# Patient Record
Sex: Male | Born: 1960 | Race: White | Hispanic: No | Marital: Married | Smoking: Never smoker
Health system: Southern US, Community
[De-identification: ages and names within clinical notes are randomized; demographics above are authoritative.]

## PROBLEM LIST (undated history)

## (undated) DIAGNOSIS — M1990 Unspecified osteoarthritis, unspecified site: Secondary | ICD-10-CM

## (undated) DIAGNOSIS — E785 Hyperlipidemia, unspecified: Secondary | ICD-10-CM

## (undated) DIAGNOSIS — C801 Malignant (primary) neoplasm, unspecified: Secondary | ICD-10-CM

## (undated) HISTORY — PX: OTHER SURGICAL HISTORY: SHX169

## (undated) HISTORY — DX: Hyperlipidemia, unspecified: E78.5

## (undated) HISTORY — PX: PROSTATE SURGERY: SHX751

---

## 2008-07-21 LAB — HM COLONOSCOPY

## 2010-09-02 ENCOUNTER — Other Ambulatory Visit: Payer: Self-pay | Admitting: Urology

## 2010-09-02 ENCOUNTER — Ambulatory Visit (HOSPITAL_COMMUNITY)
Admission: RE | Admit: 2010-09-02 | Discharge: 2010-09-02 | Disposition: A | Discharge status: Home / Self Care | Payer: BC Managed Care – PPO | Source: Ambulatory Visit | Attending: Urology | Admitting: Urology

## 2010-09-02 ENCOUNTER — Encounter (HOSPITAL_COMMUNITY): Payer: BC Managed Care – PPO

## 2010-09-02 ENCOUNTER — Other Ambulatory Visit (HOSPITAL_COMMUNITY): Payer: Self-pay | Admitting: Urology

## 2010-09-02 DIAGNOSIS — Z01818 Encounter for other preprocedural examination: Secondary | ICD-10-CM | POA: Insufficient documentation

## 2010-09-02 DIAGNOSIS — C61 Malignant neoplasm of prostate: Secondary | ICD-10-CM | POA: Insufficient documentation

## 2010-09-02 DIAGNOSIS — I498 Other specified cardiac arrhythmias: Secondary | ICD-10-CM | POA: Insufficient documentation

## 2010-09-02 DIAGNOSIS — Z01810 Encounter for preprocedural cardiovascular examination: Secondary | ICD-10-CM | POA: Insufficient documentation

## 2010-09-02 DIAGNOSIS — Z01812 Encounter for preprocedural laboratory examination: Secondary | ICD-10-CM | POA: Insufficient documentation

## 2010-09-02 LAB — CBC
Hemoglobin: 15.4 g/dL (ref 13.0–17.0)
MCH: 31 pg (ref 26.0–34.0)
MCHC: 35 g/dL (ref 30.0–36.0)
MCV: 88.5 fL (ref 78.0–100.0)
RBC: 4.97 MIL/uL (ref 4.22–5.81)

## 2010-09-02 LAB — BASIC METABOLIC PANEL
BUN: 23 mg/dL (ref 6–23)
CO2: 29 mEq/L (ref 19–32)
Calcium: 10.3 mg/dL (ref 8.4–10.5)
Creatinine, Ser: 0.83 mg/dL (ref 0.50–1.35)
GFR calc non Af Amer: >60 mL/min (ref >60)
Glucose, Bld: 96 mg/dL (ref 70–99)
Sodium: 137 mEq/L (ref 135–145)

## 2010-09-02 LAB — SURGICAL PCR SCREEN: Staphylococcus aureus: INVALID — AB

## 2010-09-05 LAB — MRSA CULTURE

## 2010-09-09 ENCOUNTER — Other Ambulatory Visit: Payer: Self-pay | Admitting: Urology

## 2010-09-09 ENCOUNTER — Inpatient Hospital Stay (HOSPITAL_COMMUNITY)
Admission: RE | Admit: 2010-09-09 | Discharge: 2010-09-11 | DRG: 335 | Disposition: A | Discharge status: Home / Self Care | Payer: BC Managed Care – PPO | Source: Ambulatory Visit | Attending: Urology | Admitting: Urology

## 2010-09-09 DIAGNOSIS — Z01812 Encounter for preprocedural laboratory examination: Secondary | ICD-10-CM

## 2010-09-09 DIAGNOSIS — C61 Malignant neoplasm of prostate: Principal | ICD-10-CM | POA: Diagnosis present

## 2010-09-09 DIAGNOSIS — E7800 Pure hypercholesterolemia, unspecified: Secondary | ICD-10-CM | POA: Diagnosis present

## 2010-09-09 LAB — HEMOGLOBIN AND HEMATOCRIT, BLOOD: HCT: 40.3 % (ref 39.0–52.0)

## 2010-09-10 LAB — HEMOGLOBIN AND HEMATOCRIT, BLOOD
HCT: 36.2 % — ABNORMAL LOW (ref 39.0–52.0)
Hemoglobin: 12.6 g/dL — ABNORMAL LOW (ref 13.0–17.0)

## 2010-09-10 NOTE — Op Note (Signed)
Scott Rodgers, NAUTA NO.:  [id number]  MEDICAL RECORD NO.:  [id number]  LOCATION:  1413                         FACILITY:  [facility]  PHYSICIAN:  Heloise Purpura, MD      DATE OF BIRTH:  1960/11/10  DATE OF PROCEDURE:  09/09/2010 DATE OF DISCHARGE:                              OPERATIVE REPORT   PREOPERATIVE DIAGNOSIS:  Clinically localized adenocarcinoma of the prostate (clinical stage T2a Nx Mx).  POSTOPERATIVE DIAGNOSIS:  Clinically localized adenocarcinoma of the prostate (clinical stage T2a Nx Mx).  PROCEDURE: 1. Robotic assisted laparoscopic radical prostatectomy (bilateral     nerve sparing). 2. Bilateral robotic assisted laparoscopic pelvic lymphadenectomy.  SURGEON:  Heloise Purpura, M.D.  ASSISTANT:  Delia Chimes, NP-C  ANESTHESIA:  General.  COMPLICATIONS:  None.  ESTIMATED BLOOD LOSS:  250 mL.  INTRAVENOUS FLUIDS:  2 L of crystalloid.  SPECIMENS: 1. Prostate and seminal vesicles. 2. Right pelvic lymph nodes. 3. Left pelvic lymph nodes.  DISPOSITION OF SPECIMENS:  To pathology.  DRAINS: 1. 20-French coude catheter. 2. #19 Blake pelvic drain.  INDICATIONS:  Scott Rodgers is a 50 year old gentleman with clinically localized prostate cancer.  After a thorough discussion regarding management options for treatment, he elected to proceed with surgical therapy and the above procedures.  The potential risks, complications, and alternative treatment options were discussed in detail and informed consent obtained.  DESCRIPTION OF PROCEDURE:  The patient was taken to the operating room and a general anesthetic was administered.  He was given preoperative antibiotics, placed in the dorsal lithotomy position, and prepped and draped in the usual sterile fashion.  Next, a preoperative time-out was performed.  A Foley catheter was inserted into the bladder and a site was selected just superior to the umbilicus for placement of the camera port.  This  was placed using a standard open Hassan technique.  This allowed entry into the peritoneal cavity under direct vision and without difficulty.  With a 0-degree lens, the abdomen was inspected.  There was no evidence of any intraabdominal injuries or other abnormalities.  The remaining ports were then placed with 8 mm robotic ports placed in the left lower quadrant, far left lower quadrant, and right lower quadrant. A 5 mm port was placed in the right upper quadrant and a 12 mm port was placed in the far right lateral abdominal wall for laparoscopic assistance.  All ports were placed under direct vision and without difficulty.  The surgical cart was then docked.  With the aid of the cautery scissors, the bladder was reflected posteriorly allowing entry into space of Retzius and identification of the endopelvic fascia and prostate.  The endopelvic fascia was then incised from the apex back to the base of the prostate and the underlying levator muscle fibers were swept laterally off the prostate thereby isolating the dorsal venous complex.  The dorsal vein was then stapled and divided with a 45 mm Flex Echelon stapler.  The bladder neck was then identified with the aid of Foley catheter manipulation and was divided anteriorly.  This exposed the catheter and the catheter balloon was deflated.  The catheter was then brought into the operative field and used to retract  the prostate anteriorly.  The posterior bladder neck was then identified and divided. The dissection proceeded between the bladder and prostate until the vasa deferentia and seminal vesicles were identified.  The vasa deferentia were isolated, divided, and lifted anteriorly.  The seminal vesicles were then dissected down to their tips with care to control the seminal vesicle arterial blood supply.  The seminal vesicles were then lifted anteriorly in the space between Denonvilliers fascia and the anterior rectum was developed with a  combination of blunt and sharp dissection. The lateral prostatic fascia was then released bilaterally allowing neurovascular bundle preservation.  The vascular pedicles of the prostate were ligated with Hem-o-Lok clips and divided with sharp-cold scissor dissection.  Care was taken to leave some periprosthetic tissue on the left lateral and apical areas due to concern about his cancer and digital rectal findings.  The urethra was then sharply transected and the prostate was disarticulated.  The pelvis was then copiously irrigated and there did appear to be bleeding from the left vascular pedicle as well as a right vascular pedicle.  On the left side, this appeared to be close to the bladder neck and was controlled with cautery.  On the right side, it was above the neurovascular bundle and therefore 3-0 Vicryl figure-of-eight sutures were used to control this bleeding.  There was also a significant amount of oozing from the distal neurovascular bundles and this was controlled temporarily with a piece of Surgicel.  Attention then turned to the right pelvic sidewall.  The fibrofatty tissue between the external iliac vein, confluence of the iliac vessels, hypogastric artery, and Cooper ligament was dissected free from the pelvic sidewall with care to preserve the obturator nerve. Hemoclips were used for lymphostasis and hemostasis.  An identical procedure was then performed on the contralateral side and both lymphatic packets were removed for permanent pathologic analysis.  A 2-0 Vicryl slip-knot was then placed between Denonvilliers fascia, the posterior bladder neck, and the posterior urethra to reapproximate these structures.  The piece of Surgicel which had been previously placed was removed.  Unfortunately, the stitch broke through the bladder neck and this stitch was brought back through the bladder neck once more and used to help reapproximate these structures, although not  completely. Therefore, the double armed 3-0 Monocryl suture was used to bring the bladder neck and urethra together and then create a 360-degree running tension-free anastomosis between these structures.  A new 20-French coude catheter was inserted into the bladder and irrigated.  There were no blood clots within the bladder and the anastomosis appeared to be watertight.  There was noted be some persistent oozing from the distal neurovascular bundles and therefore 5 cc of FloSeal was placed into this area and the previously mentioned piece of Surgicel was also utilized to help provide compression.  This was monitored and even without the pneumoperitoneum up, there appeared to be excellent hemostasis. Therefore, preparations were made for closure.  A #19 Blake drain was brought through the left robotic port and positioned appropriately within the pelvis.  It was secured to skin with a nylon suture.  The surgical cart was undocked and the right lateral 12-mm port site was closed with a 0 Vicryl suture placed laparoscopically.  All remaining ports were removed under direct vision and the prostate specimen was removed intact within the Endopouch retrieval bag via the periumbilical port site.  This fascial opening was then closed with 2 running 0 Vicryl sutures.  All port sites were injected with  0.25% percent Marcaine and reapproximated at the skin level with staples.  Sterile dressings were applied.  The patient appeared to tolerate the procedure well and without complications.  He was able to be extubated and transferred to the recovery unit in satisfactory condition.     Heloise Purpura, MD     LB/MEDQ  D:  09/09/2010  T:  09/09/2010  Job:  [id number]  Electronically Signed by Heloise Purpura MD on 09/10/2010 10:00:39 PM

## 2010-09-11 LAB — HEMOGLOBIN AND HEMATOCRIT, BLOOD
HCT: 33.9 % — ABNORMAL LOW (ref 39.0–52.0)
Hemoglobin: 11.7 g/dL — ABNORMAL LOW (ref 13.0–17.0)

## 2012-03-30 ENCOUNTER — Other Ambulatory Visit: Payer: Self-pay | Admitting: Orthopedic Surgery

## 2012-03-30 MED ORDER — BUPIVACAINE LIPOSOME 1.3 % IJ SUSP: 20.0000 mL | Freq: Once | INTRAMUSCULAR | Status: DC

## 2012-03-30 MED ORDER — DEXAMETHASONE SODIUM PHOSPHATE 10 MG/ML IJ SOLN: 10.0000 mg | Freq: Once | INTRAMUSCULAR | Status: DC

## 2012-03-30 NOTE — Progress Notes (Signed)
Preoperative surgical orders have been place into the Epic hospital system for Scott Rodgers on 03/30/2012, 12:12 PM  by Patrica Duel for surgery on 05/05/2012.  Preop Total Hip - Anterior Approach orders including Experel Injecion, IV Tylenol, and IV Decadron as long as there are no contraindications to the above medications. Avel Peace, PA-C

## 2012-04-21 ENCOUNTER — Encounter (HOSPITAL_COMMUNITY): Payer: Self-pay | Admitting: Pharmacy Technician

## 2012-04-25 NOTE — Patient Instructions (Signed)
Scott Rodgers  04/25/2012   Your procedure is scheduled on: 05/05/12    Report to [facility] at    0745  AM.  Call this number if you have problems the morning of surgery: [phone]   Remember:   Do not eat food or drink liquids after midnight.   Take these medicines the morning of surgery with A SIP OF WATER:    Do not wear jewelry,   Do not wear lotions, powders, or perfumes..  . Men may shave face and neck.  Do not bring valuables to the hospital.  Contacts, dentures or bridgework may not be worn into surgery.  Leave suitcase in the car. After surgery it may be brought to your room.  For patients admitted to the hospital, checkout time is 11:00 AM the day of  discharge.    SEE CHG INSTRUCTION SHEET    Please read over the following fact sheets that you were given: MRSA Information, coughing and deep breathing exercises, leg exercises, Blood Transfusion fact sheet, Incentive Spirometry Fact sheet                Failure to comply with these instructions may result in cancellation of your surgery.                Patient Signature ____________________________              Nurse Signature _____________________________

## 2012-04-26 ENCOUNTER — Encounter (HOSPITAL_COMMUNITY): Payer: Self-pay

## 2012-04-26 ENCOUNTER — Ambulatory Visit (HOSPITAL_COMMUNITY)
Admission: RE | Admit: 2012-04-26 | Discharge: 2012-04-26 | Disposition: A | Discharge status: Home / Self Care | Payer: BC Managed Care – PPO | Source: Ambulatory Visit | Attending: Orthopedic Surgery | Admitting: Orthopedic Surgery

## 2012-04-26 ENCOUNTER — Encounter (HOSPITAL_COMMUNITY)
Admission: RE | Admit: 2012-04-26 | Discharge: 2012-04-26 | Disposition: A | Discharge status: Home / Self Care | Payer: BC Managed Care – PPO | Source: Ambulatory Visit | Attending: Orthopedic Surgery | Admitting: Orthopedic Surgery

## 2012-04-26 DIAGNOSIS — Z01812 Encounter for preprocedural laboratory examination: Secondary | ICD-10-CM | POA: Insufficient documentation

## 2012-04-26 DIAGNOSIS — M1610 Unilateral primary osteoarthritis, unspecified hip: Secondary | ICD-10-CM | POA: Insufficient documentation

## 2012-04-26 DIAGNOSIS — Z01818 Encounter for other preprocedural examination: Secondary | ICD-10-CM | POA: Insufficient documentation

## 2012-04-26 DIAGNOSIS — M25559 Pain in unspecified hip: Secondary | ICD-10-CM | POA: Insufficient documentation

## 2012-04-26 HISTORY — DX: Malignant (primary) neoplasm, unspecified: C80.1

## 2012-04-26 HISTORY — DX: Unspecified osteoarthritis, unspecified site: M19.90

## 2012-04-26 LAB — APTT: aPTT: 31 seconds (ref 24–37)

## 2012-04-26 LAB — COMPREHENSIVE METABOLIC PANEL
AST: 22 U/L (ref 0–37)
Albumin: 4.2 g/dL (ref 3.5–5.2)
Alkaline Phosphatase: 68 U/L (ref 39–117)
Chloride: 105 mEq/L (ref 96–112)
Creatinine, Ser: 0.93 mg/dL (ref 0.50–1.35)
Potassium: 4.1 mEq/L (ref 3.5–5.1)
Total Bilirubin: 1.1 mg/dL (ref 0.3–1.2)
Total Protein: 7.5 g/dL (ref 6.0–8.3)

## 2012-04-26 LAB — SURGICAL PCR SCREEN
MRSA, PCR: NEGATIVE
Staphylococcus aureus: POSITIVE — AB

## 2012-04-26 LAB — URINALYSIS, ROUTINE W REFLEX MICROSCOPIC
Bilirubin Urine: NEGATIVE
Leukocytes, UA: NEGATIVE
Nitrite: NEGATIVE
Specific Gravity, Urine: 1.005 (ref 1.005–1.030)
pH: 5 (ref 5.0–8.0)

## 2012-04-26 LAB — CBC
Platelets: 255 10*3/uL (ref 150–400)
RDW: 12.4 % (ref 11.5–15.5)
WBC: 4.8 10*3/uL (ref 4.0–10.5)

## 2012-04-26 LAB — PROTIME-INR: INR: 0.95 (ref 0.00–1.49)

## 2012-04-26 NOTE — Progress Notes (Signed)
Nasal swab positive for Staph per lab.  Patient notified and instructed.

## 2012-05-03 NOTE — H&P (Signed)
TOTAL HIP ADMISSION H&P  Patient is admitted for left total hip arthroplasty, anterior approach.  Subjective:  Chief Complaint: left hip pain  HPI: Scott Rodgers, 52 y.o. male, has a history of pain and functional disability in the left hip(s) due to arthritis and patient has failed non-surgical conservative treatments for greater than 12 weeks to include NSAID's and/or analgesics, flexibility and strengthening excercises and activity modification.  Onset of symptoms was gradual starting 6 years ago with gradually worsening course since that time.The patient noted no past surgery on the left hip(s).  Patient currently rates pain in the left hip at 7 out of 10 with activity. Patient has night pain, worsening of pain with activity and weight bearing, pain that interfers with activities of daily living and pain with passive range of motion. Patient has evidence of subchondral sclerosis, periarticular osteophytes and joint space narrowing by imaging studies. This condition presents safety issues increasing the risk of falls. There is no current active infection.   Past Medical History  Diagnosis Date  . Cancer     prostate cancer- 2012  . Arthritis     Past Surgical History  Procedure Laterality Date  . Prostate surgery      2012   . Right thumb surgery    . Right knee meniscus surgery       1998  . Ruptured blood vessels in scrotum surgery       1977     Current outpatient prescriptions: aspirin 325 MG tablet, Take 325 mg by mouth daily., Disp: , Rfl: ;   Avanafil (STENDRA) 200 MG TABS, Take 200 mg by mouth as needed.  Disp: , Rfl: ;  glucosamine-chondroitin 500-400 MG tablet, Take 1 tablet by mouth daily. , Disp: , Rfl:  ibuprofen (ADVIL,MOTRIN) 200 MG tablet, Take 600 mg by mouth every 6 (six) hours as needed for pain., Disp: , Rfl: ;   Multiple Vitamin (MULTIVITAMIN WITH MINERALS) TABS, Take 1 tablet by mouth daily., Disp: , Rfl: ;  naproxen sodium (ANAPROX) 220 MG tablet, Take 220  mg by mouth 2 (two) times daily with a meal., Disp: , Rfl: ;   Omega-3 Fatty Acids (FISH OIL) 1200 MG CAPS, Take 1 capsule by mouth daily., Disp: , Rfl:  rosuvastatin (CRESTOR) 10 MG tablet, Take 10 mg by mouth daily before breakfast., Disp: , Rfl: ;  tadalafil (CIALIS) 5 MG tablet, Take 5 mg by mouth daily. As needed, Disp: , Rfl: ;   vardenafil (LEVITRA) 20 MG tablet, Take 20 mg by mouth daily as needed for erectile dysfunction.    No Known Allergies  History  Substance Use Topics  . Smoking status: Never Smoker   . Smokeless tobacco: Never Used  . Alcohol Use: Not on file     Comment: 15 drinks per week either wine or beer     Family History Father living age 32; hx of valvular disease, CAD Mother living age 29; hx of OA  Review of Systems  Constitutional: Negative.   HENT: Negative.  Negative for neck pain.   Eyes: Negative.   Respiratory: Negative.   Cardiovascular: Negative.   Gastrointestinal: Negative.   Genitourinary: Negative.   Musculoskeletal: Positive for joint pain. Negative for myalgias, back pain and falls.       Left hip pain   Skin: Negative.   Neurological: Negative.   Endo/Heme/Allergies: Negative.   Psychiatric/Behavioral: Negative.     Objective:  Physical Exam  Constitutional: He is oriented to person, place, and  time. He appears well-developed and well-nourished. No distress.  HENT:  Head: Normocephalic and atraumatic.  Right Ear: External ear normal.  Left Ear: External ear normal.  Nose: Nose normal.  Mouth/Throat: Oropharynx is clear and moist.  Eyes: Conjunctivae and EOM are normal.  Neck: Normal range of motion. Neck supple. No tracheal deviation present. No thyromegaly present.  Cardiovascular: Normal rate, normal heart sounds and intact distal pulses.   No murmur heard. Respiratory: Effort normal and breath sounds normal. No respiratory distress. He has no wheezes. He exhibits no tenderness.  GI: Soft. Bowel sounds are normal. He  exhibits no distension and no mass. There is no tenderness.  Musculoskeletal:       Right hip: He exhibits normal range of motion and normal strength.       Left hip: He exhibits decreased range of motion and decreased strength.       Right knee: Normal.       Left knee: Normal.       Right lower leg: He exhibits no tenderness and no swelling.       Left lower leg: He exhibits no tenderness and no swelling.  The right hip can be flexed to 120, rotated in 30, out 40, abducted to 40 without discomfort. The left hip flexes to 100, rotated in 10, out 30 and abduct to 30 with discomfort. Gait pattern is antalgic on the left.  Lymphadenopathy:    He has no cervical adenopathy.  Neurological: He is alert and oriented to person, place, and time. He has normal strength and normal reflexes. No sensory deficit.  Skin: No rash noted. He is not diaphoretic. No erythema.  Psychiatric: He has a normal mood and affect. His behavior is normal.    Vitals Weight: 205 lb Height: 71 in Body Surface Area: 2.16 m Body Mass Index: 28.59 kg/m Pulse: 74 (Regular) BP: 132/78 (Sitting, Left Arm, Standard)   Imaging Review Plain radiographs demonstrate severe degenerative joint disease of the left hip(s). The bone quality appears to be fair for age and reported activity level.  Assessment/Plan:  End stage arthritis, left hip(s)  The patient history, physical examination, clinical judgement of the provider and imaging studies are consistent with end stage degenerative joint disease of the left hip(s) and total hip arthroplasty is deemed medically necessary. The treatment options including medical management, injection therapy, arthroscopy and arthroplasty were discussed at length. The risks and benefits of total hip arthroplasty were presented and reviewed. The risks due to aseptic loosening, infection, stiffness, dislocation/subluxation,  thromboembolic complications and other imponderables were  discussed.  The patient acknowledged the explanation, agreed to proceed with the plan and consent was signed. Patient is being admitted for inpatient treatment for surgery, pain control, PT, OT, prophylactic antibiotics, VTE prophylaxis, progressive ambulation and ADL's and discharge planning.The patient is planning to be discharged home with home health services    [place], [place]

## 2012-05-05 ENCOUNTER — Inpatient Hospital Stay (HOSPITAL_COMMUNITY): Payer: BC Managed Care – PPO | Admitting: Registered Nurse

## 2012-05-05 ENCOUNTER — Encounter (HOSPITAL_COMMUNITY): Payer: Self-pay | Admitting: Registered Nurse

## 2012-05-05 ENCOUNTER — Inpatient Hospital Stay (HOSPITAL_COMMUNITY): Payer: BC Managed Care – PPO

## 2012-05-05 ENCOUNTER — Encounter (HOSPITAL_COMMUNITY)
Admission: RE | Disposition: A | Discharge status: Home Health | Payer: Self-pay | Source: Ambulatory Visit | Attending: Orthopedic Surgery

## 2012-05-05 ENCOUNTER — Inpatient Hospital Stay (HOSPITAL_COMMUNITY)
Admission: RE | Admit: 2012-05-05 | Discharge: 2012-05-07 | DRG: 818 | Disposition: A | Discharge status: Home Health | Payer: BC Managed Care – PPO | Source: Ambulatory Visit | Attending: Orthopedic Surgery | Admitting: Orthopedic Surgery

## 2012-05-05 ENCOUNTER — Encounter (HOSPITAL_COMMUNITY): Payer: Self-pay | Admitting: *Deleted

## 2012-05-05 DIAGNOSIS — M169 Osteoarthritis of hip, unspecified: Principal | ICD-10-CM | POA: Diagnosis present

## 2012-05-05 DIAGNOSIS — M1610 Unilateral primary osteoarthritis, unspecified hip: Principal | ICD-10-CM | POA: Diagnosis present

## 2012-05-05 DIAGNOSIS — Z96649 Presence of unspecified artificial hip joint: Secondary | ICD-10-CM

## 2012-05-05 DIAGNOSIS — Z8546 Personal history of malignant neoplasm of prostate: Secondary | ICD-10-CM

## 2012-05-05 HISTORY — PX: TOTAL HIP ARTHROPLASTY: SHX124

## 2012-05-05 LAB — TYPE AND SCREEN: ABO/RH(D): A POS

## 2012-05-05 SURGERY — ARTHROPLASTY, HIP, TOTAL, ANTERIOR APPROACH
Anesthesia: General | Site: Hip | Laterality: Left | Wound class: Clean

## 2012-05-05 MED ORDER — MENTHOL 3 MG MT LOZG: 1.0000 | LOZENGE | OROMUCOSAL | Status: DC | PRN

## 2012-05-05 MED ORDER — PROPOFOL 10 MG/ML IV BOLUS
INTRAVENOUS | Status: DC | PRN
  Administered 2012-05-05: 200 mg via INTRAVENOUS

## 2012-05-05 MED ORDER — RIVAROXABAN 10 MG PO TABS
10.0000 mg | ORAL_TABLET | Freq: Every day | ORAL | Status: DC
  Administered 2012-05-06 – 2012-05-07 (×2): 10 mg via ORAL
  Filled 2012-05-05 (×3): qty 1

## 2012-05-05 MED ORDER — ACETAMINOPHEN 10 MG/ML IV SOLN
1000.0000 mg | Freq: Once | INTRAVENOUS | Status: AC
  Administered 2012-05-05: 1000 mg via INTRAVENOUS

## 2012-05-05 MED ORDER — SODIUM CHLORIDE 0.9 % IV SOLN: INTRAVENOUS | Status: DC

## 2012-05-05 MED ORDER — SODIUM CHLORIDE 0.9 % IJ SOLN
INTRAMUSCULAR | Status: DC | PRN
  Administered 2012-05-05: 12:00:00

## 2012-05-05 MED ORDER — HYDROMORPHONE HCL PF 1 MG/ML IJ SOLN: 0.2500 mg | INTRAMUSCULAR | Status: DC | PRN

## 2012-05-05 MED ORDER — GLYCOPYRROLATE 0.2 MG/ML IJ SOLN
INTRAMUSCULAR | Status: DC | PRN
  Administered 2012-05-05: .8 mg via INTRAVENOUS

## 2012-05-05 MED ORDER — ONDANSETRON HCL 4 MG/2ML IJ SOLN
INTRAMUSCULAR | Status: DC | PRN
  Administered 2012-05-05: 4 mg via INTRAVENOUS

## 2012-05-05 MED ORDER — CEFAZOLIN SODIUM-DEXTROSE 2-3 GM-% IV SOLR
2.0000 g | INTRAVENOUS | Status: AC
  Administered 2012-05-05: 2 g via INTRAVENOUS

## 2012-05-05 MED ORDER — 0.9 % SODIUM CHLORIDE (POUR BTL) OPTIME
TOPICAL | Status: DC | PRN
  Administered 2012-05-05: 1000 mL

## 2012-05-05 MED ORDER — ACETAMINOPHEN 650 MG RE SUPP: 650.0000 mg | Freq: Four times a day (QID) | RECTAL | Status: DC | PRN

## 2012-05-05 MED ORDER — PHENOL 1.4 % MT LIQD: 1.0000 | OROMUCOSAL | Status: DC | PRN

## 2012-05-05 MED ORDER — METHOCARBAMOL 500 MG PO TABS
500.0000 mg | ORAL_TABLET | Freq: Four times a day (QID) | ORAL | Status: DC | PRN
  Administered 2012-05-05 – 2012-05-07 (×7): 500 mg via ORAL
  Filled 2012-05-05 (×6): qty 1

## 2012-05-05 MED ORDER — HYDROMORPHONE HCL PF 1 MG/ML IJ SOLN
INTRAMUSCULAR | Status: DC | PRN
  Administered 2012-05-05: 1 mg via INTRAVENOUS
  Administered 2012-05-05 (×2): 0.5 mg via INTRAVENOUS

## 2012-05-05 MED ORDER — DIPHENHYDRAMINE HCL 12.5 MG/5ML PO ELIX: 12.5000 mg | ORAL_SOLUTION | ORAL | Status: DC | PRN

## 2012-05-05 MED ORDER — ACETAMINOPHEN 325 MG PO TABS: 650.0000 mg | ORAL_TABLET | Freq: Four times a day (QID) | ORAL | Status: DC | PRN

## 2012-05-05 MED ORDER — HYDRALAZINE HCL 20 MG/ML IJ SOLN
INTRAMUSCULAR | Status: DC | PRN
  Administered 2012-05-05: 2.5 mg via INTRAVENOUS
  Administered 2012-05-05: 5 mg via INTRAVENOUS

## 2012-05-05 MED ORDER — METOCLOPRAMIDE HCL 10 MG PO TABS: 5.0000 mg | ORAL_TABLET | Freq: Three times a day (TID) | ORAL | Status: DC | PRN

## 2012-05-05 MED ORDER — CEFAZOLIN SODIUM 1-5 GM-% IV SOLN
1.0000 g | Freq: Four times a day (QID) | INTRAVENOUS | Status: AC
  Administered 2012-05-05 (×2): 1 g via INTRAVENOUS
  Filled 2012-05-05 (×2): qty 50

## 2012-05-05 MED ORDER — OXYCODONE HCL 5 MG PO TABS
5.0000 mg | ORAL_TABLET | ORAL | Status: DC | PRN
  Administered 2012-05-05: 5 mg via ORAL
  Administered 2012-05-06 – 2012-05-07 (×7): 10 mg via ORAL
  Filled 2012-05-05: qty 1
  Filled 2012-05-05 (×7): qty 2

## 2012-05-05 MED ORDER — PROMETHAZINE HCL 25 MG/ML IJ SOLN: 6.2500 mg | INTRAMUSCULAR | Status: DC | PRN

## 2012-05-05 MED ORDER — DEXAMETHASONE SODIUM PHOSPHATE 10 MG/ML IJ SOLN: 10.0000 mg | Freq: Once | INTRAMUSCULAR | Status: AC

## 2012-05-05 MED ORDER — FENTANYL CITRATE 0.05 MG/ML IJ SOLN
INTRAMUSCULAR | Status: DC | PRN
  Administered 2012-05-05 (×2): 50 ug via INTRAVENOUS
  Administered 2012-05-05: 100 ug via INTRAVENOUS
  Administered 2012-05-05 (×3): 50 ug via INTRAVENOUS

## 2012-05-05 MED ORDER — CHLORHEXIDINE GLUCONATE 4 % EX LIQD
60.0000 mL | Freq: Once | CUTANEOUS | Status: DC
  Filled 2012-05-05: qty 60

## 2012-05-05 MED ORDER — ATORVASTATIN CALCIUM 20 MG PO TABS
20.0000 mg | ORAL_TABLET | Freq: Every day | ORAL | Status: DC
  Administered 2012-05-05 – 2012-05-06 (×2): 20 mg via ORAL
  Filled 2012-05-05 (×3): qty 1

## 2012-05-05 MED ORDER — METHOCARBAMOL 100 MG/ML IJ SOLN: 500.0000 mg | Freq: Four times a day (QID) | INTRAVENOUS | Status: DC | PRN

## 2012-05-05 MED ORDER — BISACODYL 10 MG RE SUPP: 10.0000 mg | Freq: Every day | RECTAL | Status: DC | PRN

## 2012-05-05 MED ORDER — NEOSTIGMINE METHYLSULFATE 1 MG/ML IJ SOLN
INTRAMUSCULAR | Status: DC | PRN
  Administered 2012-05-05: 5 mg via INTRAVENOUS

## 2012-05-05 MED ORDER — BUPIVACAINE LIPOSOME 1.3 % IJ SUSP
20.0000 mL | Freq: Once | INTRAMUSCULAR | Status: DC
  Filled 2012-05-05: qty 20

## 2012-05-05 MED ORDER — DOCUSATE SODIUM 100 MG PO CAPS
100.0000 mg | ORAL_CAPSULE | Freq: Two times a day (BID) | ORAL | Status: DC
  Administered 2012-05-05 – 2012-05-07 (×4): 100 mg via ORAL

## 2012-05-05 MED ORDER — ACETAMINOPHEN 10 MG/ML IV SOLN
1000.0000 mg | Freq: Four times a day (QID) | INTRAVENOUS | Status: AC
  Administered 2012-05-05 – 2012-05-06 (×4): 1000 mg via INTRAVENOUS
  Filled 2012-05-05 (×6): qty 100

## 2012-05-05 MED ORDER — DEXTROSE-NACL 5-0.9 % IV SOLN
INTRAVENOUS | Status: DC
  Administered 2012-05-05 – 2012-05-06 (×2): via INTRAVENOUS

## 2012-05-05 MED ORDER — TRAMADOL HCL 50 MG PO TABS: 50.0000 mg | ORAL_TABLET | Freq: Four times a day (QID) | ORAL | Status: DC | PRN

## 2012-05-05 MED ORDER — METOCLOPRAMIDE HCL 5 MG/ML IJ SOLN: 5.0000 mg | Freq: Three times a day (TID) | INTRAMUSCULAR | Status: DC | PRN

## 2012-05-05 MED ORDER — ONDANSETRON HCL 4 MG/2ML IJ SOLN: 4.0000 mg | Freq: Four times a day (QID) | INTRAMUSCULAR | Status: DC | PRN

## 2012-05-05 MED ORDER — DEXAMETHASONE 6 MG PO TABS
10.0000 mg | ORAL_TABLET | Freq: Once | ORAL | Status: AC
  Administered 2012-05-06: 10 mg via ORAL
  Filled 2012-05-05: qty 1

## 2012-05-05 MED ORDER — FLEET ENEMA 7-19 GM/118ML RE ENEM: 1.0000 | ENEMA | Freq: Once | RECTAL | Status: AC | PRN

## 2012-05-05 MED ORDER — LIDOCAINE HCL (CARDIAC) 20 MG/ML IV SOLN
INTRAVENOUS | Status: DC | PRN
  Administered 2012-05-05: 80 mg via INTRAVENOUS

## 2012-05-05 MED ORDER — MIDAZOLAM HCL 5 MG/5ML IJ SOLN
INTRAMUSCULAR | Status: DC | PRN
  Administered 2012-05-05: 2 mg via INTRAVENOUS

## 2012-05-05 MED ORDER — ROCURONIUM BROMIDE 100 MG/10ML IV SOLN
INTRAVENOUS | Status: DC | PRN
  Administered 2012-05-05: 10 mg via INTRAVENOUS
  Administered 2012-05-05: 50 mg via INTRAVENOUS
  Administered 2012-05-05: 10 mg via INTRAVENOUS

## 2012-05-05 MED ORDER — POLYETHYLENE GLYCOL 3350 17 G PO PACK: 17.0000 g | PACK | Freq: Every day | ORAL | Status: DC | PRN

## 2012-05-05 MED ORDER — ONDANSETRON HCL 4 MG PO TABS: 4.0000 mg | ORAL_TABLET | Freq: Four times a day (QID) | ORAL | Status: DC | PRN

## 2012-05-05 MED ORDER — LACTATED RINGERS IV SOLN
INTRAVENOUS | Status: DC | PRN
  Administered 2012-05-05: 09:00:00 via INTRAVENOUS

## 2012-05-05 MED ORDER — MORPHINE SULFATE 2 MG/ML IJ SOLN
1.0000 mg | INTRAMUSCULAR | Status: DC | PRN
  Administered 2012-05-05: 2 mg via INTRAVENOUS
  Administered 2012-05-05: 1 mg via INTRAVENOUS
  Administered 2012-05-05: 2 mg via INTRAVENOUS
  Filled 2012-05-05 (×3): qty 1

## 2012-05-05 SURGICAL SUPPLY — 42 items
BAG ZIPLOCK 12X15 (MISCELLANEOUS) ×4 IMPLANT
BLADE SAW SGTL 18X1.27X75 (BLADE) ×2 IMPLANT
CLOTH BEACON ORANGE TIMEOUT ST (SAFETY) ×2 IMPLANT
CLSR STERI-STRIP ANTIMIC 1/2X4 (GAUZE/BANDAGES/DRESSINGS) ×4 IMPLANT
DECANTER SPIKE VIAL GLASS SM (MISCELLANEOUS) ×2 IMPLANT
DRAPE C-ARM 42X72 X-RAY (DRAPES) ×2 IMPLANT
DRAPE STERI IOBAN 125X83 (DRAPES) ×2 IMPLANT
DRAPE U-SHAPE 47X51 STRL (DRAPES) ×6 IMPLANT
DRSG ADAPTIC 3X8 NADH LF (GAUZE/BANDAGES/DRESSINGS) ×2 IMPLANT
DRSG MEPILEX BORDER 4X4 (GAUZE/BANDAGES/DRESSINGS) ×2 IMPLANT
DRSG MEPILEX BORDER 4X8 (GAUZE/BANDAGES/DRESSINGS) ×2 IMPLANT
DURAPREP 26ML APPLICATOR (WOUND CARE) ×2 IMPLANT
ELECT BLADE 6.5 EXT (BLADE) ×2 IMPLANT
ELECT REM PT RETURN 9FT ADLT (ELECTROSURGICAL) ×2
ELECTRODE REM PT RTRN 9FT ADLT (ELECTROSURGICAL) ×1 IMPLANT
EVACUATOR 1/8 PVC DRAIN (DRAIN) ×2 IMPLANT
FACESHIELD LNG OPTICON STERILE (SAFETY) ×8 IMPLANT
GLOVE BIO SURGEON STRL SZ7.5 (GLOVE) ×2 IMPLANT
GLOVE BIO SURGEON STRL SZ8 (GLOVE) ×4 IMPLANT
GLOVE BIOGEL PI IND STRL 8 (GLOVE) ×2 IMPLANT
GLOVE BIOGEL PI INDICATOR 8 (GLOVE) ×2
GOWN STRL NON-REIN LRG LVL3 (GOWN DISPOSABLE) ×4 IMPLANT
GOWN STRL REIN XL XLG (GOWN DISPOSABLE) ×4 IMPLANT
KIT BASIN OR (CUSTOM PROCEDURE TRAY) ×2 IMPLANT
NDL SAFETY ECLIPSE 18X1.5 (NEEDLE) IMPLANT
NEEDLE HYPO 18GX1.5 SHARP (NEEDLE)
NEEDLE SPNL 20GX3.5 QUINCKE YW (NEEDLE) ×2 IMPLANT
PACK TOTAL JOINT (CUSTOM PROCEDURE TRAY) ×2 IMPLANT
PADDING CAST COTTON 6X4 STRL (CAST SUPPLIES) ×2 IMPLANT
SPONGE GAUZE 4X4 12PLY (GAUZE/BANDAGES/DRESSINGS) ×2 IMPLANT
SUCTION FRAZIER 12FR DISP (SUCTIONS) IMPLANT
SUT ETHIBOND NAB CT1 #1 30IN (SUTURE) ×6 IMPLANT
SUT MNCRL AB 4-0 PS2 18 (SUTURE) ×2 IMPLANT
SUT VIC AB 1 CT1 27 (SUTURE) ×1
SUT VIC AB 1 CT1 27XBRD ANTBC (SUTURE) ×1 IMPLANT
SUT VIC AB 2-0 CT1 27 (SUTURE) ×2
SUT VIC AB 2-0 CT1 TAPERPNT 27 (SUTURE) ×2 IMPLANT
SUT VLOC 180 0 24IN GS25 (SUTURE) ×2 IMPLANT
SYR 50ML LL SCALE MARK (SYRINGE) ×2 IMPLANT
TOWEL OR 17X26 10 PK STRL BLUE (TOWEL DISPOSABLE) ×4 IMPLANT
TRAY FOLEY CATH 14FRSI W/METER (CATHETERS) ×2 IMPLANT
WATER STERILE IRR 1500ML POUR (IV SOLUTION) ×2 IMPLANT

## 2012-05-05 NOTE — Anesthesia Postprocedure Evaluation (Signed)
  Anesthesia Post-op Note  Patient: Scott Rodgers  Procedure(s) Performed: Procedure(s) (LRB): TOTAL HIP ARTHROPLASTY ANTERIOR APPROACH (Left)  Patient Location: PACU  Anesthesia Type: General  Level of Consciousness: awake and alert   Airway and Oxygen Therapy: Patient Spontanous Breathing  Post-op Pain: mild  Post-op Assessment: Post-op Vital signs reviewed, Patient's Cardiovascular Status Stable, Respiratory Function Stable, Patent Airway and No signs of Nausea or vomiting  Last Vitals:  Filed Vitals:   05/05/12 1438  BP: 138/90  Pulse: 56  Temp: 36.5 C  Resp: 12    Post-op Vital Signs: stable   Complications: No apparent anesthesia complications

## 2012-05-05 NOTE — Transfer of Care (Signed)
Immediate Anesthesia Transfer of Care Note  Patient: Scott Rodgers  Procedure(s) Performed: Procedure(s): TOTAL HIP ARTHROPLASTY ANTERIOR APPROACH (Left)  Patient Location: PACU  Anesthesia Type:General  Level of Consciousness: awake, alert , oriented and patient cooperative  Airway & Oxygen Therapy: Patient Spontanous Breathing and Patient connected to face mask oxygen  Post-op Assessment: Report given to PACU RN, Post -op Vital signs reviewed and stable and Patient moving all extremities  Post vital signs: Reviewed and stable  Complications: No apparent anesthesia complications

## 2012-05-05 NOTE — Progress Notes (Signed)
Portable AP PELVIS X-RAY results noted.

## 2012-05-05 NOTE — Anesthesia Preprocedure Evaluation (Addendum)
Anesthesia Evaluation  Patient identified by MRN, date of birth, ID band Patient awake    Reviewed: Allergy & Precautions, H&P , NPO status , Patient's Chart, lab work & pertinent test results  Airway Mallampati: II TM Distance: >3 FB Neck ROM: Full    Dental no notable dental hx.    Pulmonary neg pulmonary ROS,  breath sounds clear to auscultation  Pulmonary exam normal       Cardiovascular Exercise Tolerance: Good negative cardio ROS  Rhythm:Regular Rate:Normal     Neuro/Psych negative neurological ROS  negative psych ROS   GI/Hepatic negative GI ROS, Neg liver ROS,   Endo/Other  negative endocrine ROS  Renal/GU negative Renal ROS  negative genitourinary   Musculoskeletal negative musculoskeletal ROS (+)   Abdominal   Peds negative pediatric ROS (+)  Hematology negative hematology ROS (+)   Anesthesia Other Findings   Reproductive/Obstetrics negative OB ROS                          Anesthesia Physical Anesthesia Plan  ASA: II  Anesthesia Plan: General   Post-op Pain Management:    Induction: Intravenous  Airway Management Planned: Oral ETT  Additional Equipment:   Intra-op Plan:   Post-operative Plan: Extubation in OR  Informed Consent: I have reviewed the patients History and Physical, chart, labs and discussed the procedure including the risks, benefits and alternatives for the proposed anesthesia with the patient or authorized representative who has indicated his/her understanding and acceptance.   Dental advisory given  Plan Discussed with: CRNA  Anesthesia Plan Comments: (Discussed general versus spinal. Patient chooses general.)       Anesthesia Quick Evaluation

## 2012-05-05 NOTE — Progress Notes (Signed)
Dr. Lequita Halt in- made aware of patient's left foot feeling cool to the touch- left dorsalis pedis pulse audible with doppler

## 2012-05-05 NOTE — Progress Notes (Signed)
Portable AP PELVIS  X-RAY done. 

## 2012-05-05 NOTE — Progress Notes (Signed)
Dr. Council Mechanic made aware of patient's heart rate being  In the 40s-50s

## 2012-05-05 NOTE — Preoperative (Signed)
Beta Blockers   Reason not to administer Beta Blockers:Not Applicable 

## 2012-05-05 NOTE — Interval H&P Note (Signed)
History and Physical Interval Note:  05/05/2012 10:08 AM  Scott Rodgers  has presented today for surgery, with the diagnosis of Osteoarthritis of the Left Hip  The various methods of treatment have been discussed with the patient and family. After consideration of risks, benefits and other options for treatment, the patient has consented to  Procedure(s): TOTAL HIP ARTHROPLASTY ANTERIOR APPROACH (Left) as a surgical intervention .  The patient's history has been reviewed, patient examined, no change in status, stable for surgery.  I have reviewed the patient's chart and labs.  Questions were answered to the patient's satisfaction.     Loanne Drilling

## 2012-05-05 NOTE — Op Note (Signed)
OPERATIVE REPORT  PREOPERATIVE DIAGNOSIS: Osteoarthritis of the Left hip.   POSTOPERATIVE DIAGNOSIS: Osteoarthritis of the Left  hip.   PROCEDURE: Left total hip arthroplasty, anterior approach.   SURGEON: Ollen Gross, MD   ASSISTANT: Avel Peace, PA-C  ANESTHESIA:  General  ESTIMATED BLOOD LOSS:- 700  DRAINS: Hemovac x1.   COMPLICATIONS: None   CONDITION: PACU - hemodynamically stable.   BRIEF CLINICAL NOTE: Scott Rodgers is a 52 y.o. male who has advanced end-  stage arthritis of his Left  hip with progressively worsening pain and  dysfunction.The patient has failed nonoperative management and presents for  total hip arthroplasty.   PROCEDURE IN DETAIL: After successful administration of spinal  anesthetic, the traction boots for the [facility] bed were placed on both  feet and the patient was placed onto the [facility] bed, boots placed into the leg  holders. The Left hip was then isolated from the perineum with plastic  drapes and prepped and draped in the usual sterile fashion. ASIS and  greater trochanter were marked and a oblique incision was made, starting  at about 1 cm lateral and 2 cm distal to the ASIS and coursing towards  the anterior cortex of the femur. The skin was cut with a 10 blade  through subcutaneous tissue to the level of the fascia overlying the  tensor fascia lata muscle. The fascia was then incised in line with the  incision at the junction of the anterior third and posterior 2/3rd. The  muscle was teased off the fascia and then the interval between the TFL  and the rectus was developed. The Hohmann retractor was then placed at  the top of the femoral neck over the capsule. The vessels overlying the  capsule were cauterized and the fat on top of the capsule was removed.  A Hohmann retractor was then placed anterior underneath the rectus  femoris to give exposure to the entire anterior capsule. A T-shaped  capsulotomy was performed. The  edges were tagged and the femoral head  was identified.       Osteophytes are removed off the superior acetabulum.  The femoral neck was then cut in situ with an oscillating saw. Traction  was then applied to the left lower extremity utilizing the [facility]  traction. The femoral head was then removed. Retractors were placed  around the acetabulum and then circumferential removal of the labrum was  performed. Osteophytes were also removed. Reaming starts at 47 mm to  medialize and  Increased in 2 mm increments to 53 mm. We reamed in  approximately 40 degrees of abduction, 20 degrees anteversion. A 54 mm  pinnacle acetabular shell was then impacted in anatomic position under  fluoroscopic guidance with excellent purchase. We did not need to place  any additional dome screws. A 36 mm neutral + 4 marathon liner was then  placed into the acetabular shell.       The femoral lift was then placed along the lateral aspect of the femur  just distal to the vastus ridge. The leg was  externally rotated and capsule  was stripped off the inferior aspect of the femoral neck down to the  level of the lesser trochanter, this was done with electrocautery. The femur was lifted after this was performed. The  leg was then placed and extended in adducted position to essentially delivering the femur. We also removed the capsule superiorly and the  piriformis from the piriformis fossa  to gain excellent exposure of the  proximal femur. Rongeur was used to remove some cancellous bone to get  into the lateral portion of the proximal femur for placement of the  initial starter reamer. The starter broaches was placed  the starter broach  and was shown to go down the center of the canal. Broaching  with the  Corail system was then performed starting at size 8, coursing  Up to size 12. A size 12 had excellent torsional and rotational  and axial stability. The trial standard offset neck was then placed  with a 36 + 1.5 trial  head. The hip was then reduced. We confirmed that  the stem was in the canal both on AP and lateral x-rays. It also has excellent sizing. The hip was reduced with outstanding stability through full extension, full external rotation,  and then flexion in adduction internal rotation. AP pelvis was taken  and the leg lengths were measured and found to be exactly equal. Hip  was then dislocated again and the femoral head and neck removed. The  femoral broach was removed. Size 12 Corail stem with a standard offset  neck was then impacted into the femur following native anteversion. Has  excellent purchase in the canal. Excellent torsional and rotational and  axial stability. It is confirmed to be in the canal on AP and lateral  fluoroscopic views. The 36 + 1.5 ceramic head was placed and the hip  reduced with outstanding stability. Again AP pelvis was taken and it  confirmed that the leg lengths were equal. The wound was then copiously  irrigated with saline solution and the capsule reattached and repaired  with Ethibond suture.  20 mL of Exparel mixed with 50 mL of saline injected  into the capsule and into the edge of the tensor fascia lata as well as  subcutaneous tissue. The fascia overlying the tensor fascia lata was  then closed with a running #1 V-Loc. Subcu was closed with interrupted  2-0 Vicryl and subcuticular running 4-0 Monocryl. Incision was cleaned  and dried. Steri-Strips and a bulky sterile dressing applied. Hemovac  drain was hooked to suction and then he was awakened and transported to  recovery in stable condition.        Please note that a surgical assistant was a medical necessity for this procedure to perform it in a safe and expeditious manner. Assistant was necessary to provide appropriate retraction of vital neurovascular structures and to prevent femoral fracture and allow for anatomic placement of the prosthesis.  Ollen Gross, M.D.

## 2012-05-06 ENCOUNTER — Encounter (HOSPITAL_COMMUNITY): Payer: Self-pay | Admitting: Orthopedic Surgery

## 2012-05-06 LAB — BASIC METABOLIC PANEL
CO2: 31 mEq/L (ref 19–32)
Calcium: 8.4 mg/dL (ref 8.4–10.5)
Creatinine, Ser: 0.97 mg/dL (ref 0.50–1.35)
GFR calc Af Amer: >90 mL/min (ref >90)
GFR calc non Af Amer: >90 mL/min (ref >90)

## 2012-05-06 LAB — CBC
MCH: 31 pg (ref 26.0–34.0)
MCHC: 35.1 g/dL (ref 30.0–36.0)
MCV: 88.3 fL (ref 78.0–100.0)
Platelets: 176 10*3/uL (ref 150–400)
RDW: 12.7 % (ref 11.5–15.5)

## 2012-05-06 MED ORDER — ALUM & MAG HYDROXIDE-SIMETH 200-200-20 MG/5ML PO SUSP
30.0000 mL | ORAL | Status: DC | PRN
  Administered 2012-05-06: 30 mL via ORAL
  Filled 2012-05-06: qty 30

## 2012-05-06 NOTE — Progress Notes (Signed)
Utilization review completed.  

## 2012-05-06 NOTE — Progress Notes (Signed)
   Subjective: 1 Day Post-Op Procedure(s) (LRB): TOTAL HIP ARTHROPLASTY ANTERIOR APPROACH (Left) Patient reports pain as mild.   Patient seen in rounds with Dr. Lequita Halt. Patient is well, and has had no acute complaints or problems We will start therapy today.  Plan is to go Home after hospital stay.  Objective: Vital signs in last 24 hours: Temp:  [97.4 F (36.3 C)-99.6 F (37.6 C)] 98.2 F (36.8 C) (03/27 0518) Pulse Rate:  [46-91] 61 (03/27 0518) Resp:  [7-16] 16 (03/27 0518) BP: (107-158)/(68-107) 121/79 mmHg (03/27 0518) SpO2:  [96 %-100 %] 99 % (03/27 0518) FiO2 (%):  [100 %] 100 % (03/26 1445) Weight:  [94.348 kg (208 lb)] 94.348 kg (208 lb) (03/26 1445)  Intake/Output from previous day:  Intake/Output Summary (Last 24 hours) at 05/06/12 0748 Last data filed at 05/06/12 0700  Gross per 24 hour  Intake   4277 ml  Output   3780 ml  Net    497 ml    Intake/Output this shift:    Labs:  Recent Labs  05/06/12 0450  HGB 12.5*    Recent Labs  05/06/12 0450  WBC 8.1  RBC 4.03*  HCT 35.6*  PLT 176    Recent Labs  05/06/12 0450  NA 138  K 4.0  CL 104  CO2 31  BUN 10  CREATININE 0.97  GLUCOSE 112*  CALCIUM 8.4   No results found for this basename: LABPT, INR,  in the last 72 hours  EXAM General - Patient is Alert, Appropriate and Oriented Extremity - Neurovascular intact Sensation intact distally Dorsiflexion/Plantar flexion intact Dressing - dressing C/D/I Motor Function - intact, moving foot and toes well on exam.  Hemovac pulled without difficulty.  Past Medical History  Diagnosis Date  . Cancer     prostate cancer- 2012  . Arthritis     Assessment/Plan: 1 Day Post-Op Procedure(s) (LRB): TOTAL HIP ARTHROPLASTY ANTERIOR APPROACH (Left) Principal Problem:   OA (osteoarthritis) of hip  Estimated body mass index is 28.2 kg/(m^2) as calculated from the following:   Height as of this encounter: 6' (1.829 m).   Weight as of this  encounter: 94.348 kg (208 lb). Advance diet Up with therapy Plan for discharge tomorrow Discharge home with home health  DVT Prophylaxis - Xarelto, ASA 325 mg on hold Weight Bearing As Tolerated left Leg Hemovac Pulled Begin Therapy No vaccines.  [name] 05/06/2012, 7:48 AM  3

## 2012-05-06 NOTE — Care Management Note (Signed)
    Page 1 of 2   05/07/2012     1:14:05 PM   CARE MANAGEMENT NOTE 05/07/2012  Patient:  KANISHK, STROEBEL   Account Number:  [id number]  Date Initiated:  05/06/2012  Documentation initiated by:  Colleen Can  Subjective/Objective Assessment:   dx osteoarthritis left hip; total hip replacemnt-anterior approach     Action/Plan:   CM spoke with patient. Plans are for patient to return to his home in [place] where spouse will be caregiver. He already has RW and commode seat.   Anticipated DC Date:  05/07/2012   Anticipated DC Plan:  HOME W HOME HEALTH SERVICES      DC Planning Services  CM consult      [facility] Choice  HOME HEALTH   Choice offered to / List presented to:  C-1 Patient        HH arranged  HH-2 PT      [facility] agency  [facility]   Status of service:  Completed, signed off Medicare Important Message given?  NO (If response is "NO", the following Medicare IM given date fields will be blank) Date Medicare IM given:   Date Additional Medicare IM given:    Discharge Disposition:  HOME W HOME HEALTH SERVICES  Per UR Regulation:    If discussed at Long Length of Stay Meetings, dates discussed:    Comments:  05/07/2012 Colleen Can BSN RN CCM [phone] [facility] will not be able to provide services. [facility] will not be able to provide services. [facility] can provide services with start date of tomorrow 05/08/2012. Patient advised of contact number for [facility].  05/06/2012 Colleen Can BSN RN CCM [phone] Tct [facility] intake to see if they can provide services. They will call bck in AM. CM to f/u.

## 2012-05-06 NOTE — Evaluation (Addendum)
Physical Therapy Evaluation Patient Details Name: Scott Rodgers MRN: [id number] DOB: 05-04-1960 Today's Date: 05/06/2012 Time: 8295-6213 PT Time Calculation (min): 26 min  PT Assessment / Plan / Recommendation Clinical Impression  52 yo male s/p L THA-direct anterior. On eval pt required Min guard-Min assist for mobility. Able to ambulate ~100 feet with RW. Anticipate pt will progress well during stay. Recommend HHPT.     PT Assessment  Patient needs continued PT services    Follow Up Recommendations  Home health PT    Does the patient have the potential to tolerate intense rehabilitation      Barriers to Discharge        Equipment Recommendations  None recommended by PT    Recommendations for Other Services OT consult   Frequency 7X/week    Precautions / Restrictions Precautions Precautions: None Restrictions Weight Bearing Restrictions: No RLE Weight Bearing: Weight bearing as tolerated   Pertinent Vitals/Pain 4/10 L hip "like muscle pain/soreness"      Mobility  Bed Mobility Bed Mobility: Supine to Sit Supine to Sit: 4: Min assist Details for Bed Mobility Assistance: Assist for L LE off bed.  Transfers Transfers: Sit to Stand;Stand to Sit Sit to Stand: 4: Min assist;From bed;From elevated surface Stand to Sit: 4: Min assist;To chair/3-in-1;With armrests Details for Transfer Assistance: VCs safety, technique, hand placement. Assist to rise, steady, control descent Ambulation/Gait Ambulation/Gait Assistance: 4: Min guard Ambulation Distance (Feet): 100 Feet Assistive device: Rolling walker Ambulation/Gait Assistance Details: VCs safety, technique, sequence, step length. Slow gait speed. Pt beginning to use reciprocal gait pattern.  Gait Pattern: Step-to pattern;Step-through pattern;Antalgic;Decreased stride length;Decreased step length - left    Exercises     PT Diagnosis: Difficulty walking;Acute pain;Abnormality of gait  PT Problem List: Decreased  strength;Decreased range of motion;Decreased activity tolerance;Decreased mobility;Pain;Decreased knowledge of use of DME PT Treatment Interventions: DME instruction;Gait training;Stair training;Functional mobility training;Therapeutic activities;Other (comment);Patient/family education   PT Goals Acute Rehab PT Goals PT Goal Formulation: With patient Time For Goal Achievement: 05/13/12 Potential to Achieve Goals: Good Pt will go Supine/Side to Sit: with supervision PT Goal: Supine/Side to Sit - Progress: Goal set today Pt will go Sit to Supine/Side: with supervision PT Goal: Sit to Supine/Side - Progress: Goal set today Pt will go Sit to Stand: with supervision PT Goal: Sit to Stand - Progress: Goal set today Pt will Ambulate: >150 feet;with supervision;with rolling walker PT Goal: Ambulate - Progress: Goal set today Pt will Go Up / Down Stairs: 1-2 stairs;with min assist;with least restrictive assistive device PT Goal: Up/Down Stairs - Progress: Goal set today Pt will Perform Home Exercise Program: with supervision, verbal cues required/provided PT Goal: Perform Home Exercise Program - Progress: Goal set today  Visit Information  Last PT Received On: 05/06/12 Assistance Needed: +1    Subjective Data  Subjective: Its just muscle pain Patient Stated Goal: home. regain independence   Prior Functioning  Home Living Lives With: Spouse Available Help at Discharge: Family Type of Home: House Home Access: Stairs to enter [employer] of Steps: 2 Entrance Stairs-Rails: None Home Layout: Two level;Able to live on main level with bedroom/bathroom Bathroom Shower/Tub: Engineer, manufacturing systems: Standard Home Adaptive Equipment: Bedside commode/3-in-1;Sock aid;Walker - rolling;Straight cane Additional Comments: leg lifter Prior Function Level of Independence: Independent Able to Take Stairs?: Yes Driving: Yes Communication Communication: No difficulties     Cognition  Cognition Overall Cognitive Status: Appears within functional limits for tasks assessed/performed Arousal/Alertness: Awake/alert Orientation Level: Appears intact  for tasks assessed Behavior During Session: [facility] for tasks performed    Extremity/Trunk Assessment Right Lower Extremity Assessment RLE ROM/Strength/Tone: Deficits RLE ROM/Strength/Tone Deficits: WFL Left Lower Extremity Assessment LLE ROM/Strength/Tone: hip flex 2/5, hip abd/add 2/5, moves ankle well Trunk Assessment Trunk Assessment: Normal   Balance    End of Session PT - End of Session Activity Tolerance: Patient tolerated treatment well Patient left: in chair;with call bell/phone within reach;with family/visitor present  GP     Rebeca Alert, MPT Pager: [phone]

## 2012-05-06 NOTE — Progress Notes (Signed)
Physical Therapy Treatment Patient Details Name: Scott Rodgers MRN: [id number] DOB: [birth year] Today's Date: 05/06/2012 Time: 4098-1191 PT Time Calculation (min): 26 min  PT Assessment / Plan / Recommendation Comments on Treatment Session  Progressing very well with mobility. Pt tolerated increased ambulation distance and exercises. Recommend HHPT. Will plan to practice stairs on tomorrow.     Follow Up Recommendations  Home health PT     Does the patient have the potential to tolerate intense rehabilitation     Barriers to Discharge        Equipment Recommendations  None recommended by PT    Recommendations for Other Services OT consult  Frequency 7X/week   Plan Discharge plan remains appropriate    Precautions / Restrictions Precautions Precautions: None Restrictions Weight Bearing Restrictions: No RLE Weight Bearing: Weight bearing as tolerated   Pertinent Vitals/Pain 3/10 L hip    Mobility  Bed Mobility Bed Mobility: Supine to Sit;Sit to Supine Supine to Sit: 5: Supervision Details for Bed Mobility Assistance: Pt used leg lifter. VCS safety, technique Transfers Transfers: Sit to Stand;Stand to Sit Sit to Stand: 5: Supervision;From bed Stand to Sit: 5: Supervision;To bed Details for Transfer Assistance: VCs safety, technique, hand placement.  Ambulation/Gait Ambulation/Gait Assistance: 5: Supervision Ambulation Distance (Feet): 185 Feet Assistive device: Rolling walker Ambulation/Gait Assistance Details: VCs safety, technique, sequence, step length. Slow gait speed. Pt beginning to use reciprocal gait pattern.  Gait Pattern: Step-through pattern;Decreased step length - left;Decreased step length - right;Decreased stride length    Exercises Total Joint Exercises Ankle Circles/Pumps: AROM;Both;20 reps;Supine Quad Sets: AROM;Both;10 reps;Supine Short Arc Quad: AROM;Left;10 reps;Supine Heel Slides: AAROM;Left;10 reps;Supine Hip ABduction/ADduction:  AAROM;Left;10 reps;Supine   PT Diagnosis: Difficulty walking;Acute pain;Abnormality of gait  PT Problem List: Decreased strength;Decreased range of motion;Decreased activity tolerance;Decreased mobility;Pain;Decreased knowledge of use of DME PT Treatment Interventions: DME instruction;Gait training;Stair training;Functional mobility training;Therapeutic activities;Other (comment);Patient/family education   PT Goals Acute Rehab PT Goals PT Goal Formulation: With patient Time For Goal Achievement: 05/13/12 Potential to Achieve Goals: Good Pt will go Supine/Side to Sit: with supervision PT Goal: Supine/Side to Sit - Progress: Met Pt will go Sit to Supine/Side: with supervision PT Goal: Sit to Supine/Side - Progress: Met Pt will go Sit to Stand: with supervision PT Goal: Sit to Stand - Progress: Met Pt will Ambulate: >150 feet;with supervision;with rolling walker PT Goal: Ambulate - Progress: Met Pt will Go Up / Down Stairs: 1-2 stairs;with min assist;with least restrictive assistive device PT Goal: Up/Down Stairs - Progress: Goal set today Pt will Perform Home Exercise Program: with supervision, verbal cues required/provided PT Goal: Perform Home Exercise Program - Progress: Progressing toward goal  Visit Information  Last PT Received On: 05/06/12 Assistance Needed: +1    Subjective Data  Subjective: It gets stiff quickly Patient Stated Goal: home. regain independence   Cognition  Cognition Overall Cognitive Status: Appears within functional limits for tasks assessed/performed Arousal/Alertness: Awake/alert Orientation Level: Appears intact for tasks assessed Behavior During Session: [facility] for tasks performed    Balance     End of Session PT - End of Session Activity Tolerance: Patient tolerated treatment well Patient left: in bed;with call bell/phone within reach   GP     Rebeca Alert, MPT Pager: [phone]

## 2012-05-07 LAB — CBC
MCV: 87.8 fL (ref 78.0–100.0)
Platelets: 182 10*3/uL (ref 150–400)
RDW: 12.5 % (ref 11.5–15.5)
WBC: 9.1 10*3/uL (ref 4.0–10.5)

## 2012-05-07 LAB — BASIC METABOLIC PANEL
Calcium: 8.9 mg/dL (ref 8.4–10.5)
Chloride: 103 mEq/L (ref 96–112)
Creatinine, Ser: 0.82 mg/dL (ref 0.50–1.35)
GFR calc Af Amer: >90 mL/min (ref >90)

## 2012-05-07 MED ORDER — TRAMADOL HCL 50 MG PO TABS: 50.0000 mg | ORAL_TABLET | Freq: Four times a day (QID) | ORAL | Status: DC | PRN

## 2012-05-07 MED ORDER — RIVAROXABAN 10 MG PO TABS: 10.0000 mg | ORAL_TABLET | Freq: Every day | ORAL | Status: DC

## 2012-05-07 MED ORDER — OXYCODONE HCL 5 MG PO TABS: 5.0000 mg | ORAL_TABLET | ORAL | Status: DC | PRN

## 2012-05-07 MED ORDER — METHOCARBAMOL 500 MG PO TABS: 500.0000 mg | ORAL_TABLET | Freq: Four times a day (QID) | ORAL | Status: DC | PRN

## 2012-05-07 NOTE — Discharge Summary (Signed)
Physician Discharge Summary   Patient ID: Scott Rodgers MRN: [id number] DOB/AGE: 1960-12-31 52 y.o.  Admit date: 05/05/2012 Discharge date: 05/07/2012  Primary Diagnosis:  Osteoarthritis of the Left hip.  Admission Diagnoses:  Past Medical History  Diagnosis Date  . Cancer     prostate cancer- 2012  . Arthritis    Discharge Diagnoses:   Principal Problem:   OA (osteoarthritis) of hip  Estimated body mass index is 28.2 kg/(m^2) as calculated from the following:   Height as of this encounter: 6' (1.829 m).   Weight as of this encounter: 94.348 kg (208 lb).  Procedure(s) (LRB): TOTAL HIP ARTHROPLASTY ANTERIOR APPROACH (Left)   Consults: None  HPI: Scott Rodgers is a 52 y.o. male who has advanced end-  stage arthritis of his Left hip with progressively worsening pain and  dysfunction.The patient has failed nonoperative management and presents for  total hip arthroplasty.   Laboratory Data: Admission on 05/05/2012  Component Date Value Range Status  . ABO/RH(D) 05/05/2012 A POS   Final  . Antibody Screen 05/05/2012 NEG   Final  . Sample Expiration 05/05/2012 05/08/2012   Final  . WBC 05/06/2012 8.1  4.0 - 10.5 K/uL Final  . RBC 05/06/2012 4.03* 4.22 - 5.81 MIL/uL Final  . Hemoglobin 05/06/2012 12.5* 13.0 - 17.0 g/dL Final  . HCT 40/98/1191 35.6* 39.0 - 52.0 % Final  . MCV 05/06/2012 88.3  78.0 - 100.0 fL Final  . MCH 05/06/2012 31.0  26.0 - 34.0 pg Final  . MCHC 05/06/2012 35.1  30.0 - 36.0 g/dL Final  . RDW 47/82/9562 12.7  11.5 - 15.5 % Final  . Platelets 05/06/2012 176  150 - 400 K/uL Final  . Sodium 05/06/2012 138  135 - 145 mEq/L Final  . Potassium 05/06/2012 4.0  3.5 - 5.1 mEq/L Final  . Chloride 05/06/2012 104  96 - 112 mEq/L Final  . CO2 05/06/2012 31  19 - 32 mEq/L Final  . Glucose, Bld 05/06/2012 112* 70 - 99 mg/dL Final  . BUN 13/09/6576 10  6 - 23 mg/dL Final  . Creatinine, Ser 05/06/2012 0.97  0.50 - 1.35 mg/dL Final  . Calcium 46/96/2952 8.4  8.4 -  10.5 mg/dL Final  . GFR calc non Af Amer 05/06/2012 >90  >90 mL/min Final  . GFR calc Af Amer 05/06/2012 >90  >90 mL/min Final   Comment:                                 The eGFR has been calculated                          using the CKD EPI equation.                          This calculation has not been                          validated in all clinical                          situations.                          eGFR's persistently                          <  90 mL/min signify                          possible Chronic Kidney Disease.  . WBC 05/07/2012 9.1  4.0 - 10.5 K/uL Final  . RBC 05/07/2012 3.62* 4.22 - 5.81 MIL/uL Final  . Hemoglobin 05/07/2012 11.3* 13.0 - 17.0 g/dL Final  . HCT 40/98/1191 31.8* 39.0 - 52.0 % Final  . MCV 05/07/2012 87.8  78.0 - 100.0 fL Final  . MCH 05/07/2012 31.2  26.0 - 34.0 pg Final  . MCHC 05/07/2012 35.5  30.0 - 36.0 g/dL Final  . RDW 47/82/9562 12.5  11.5 - 15.5 % Final  . Platelets 05/07/2012 182  150 - 400 K/uL Final  . Sodium 05/07/2012 139  135 - 145 mEq/L Final  . Potassium 05/07/2012 3.8  3.5 - 5.1 mEq/L Final  . Chloride 05/07/2012 103  96 - 112 mEq/L Final  . CO2 05/07/2012 26  19 - 32 mEq/L Final  . Glucose, Bld 05/07/2012 107* 70 - 99 mg/dL Final  . BUN 13/09/6576 11  6 - 23 mg/dL Final  . Creatinine, Ser 05/07/2012 0.82  0.50 - 1.35 mg/dL Final  . Calcium 46/96/2952 8.9  8.4 - 10.5 mg/dL Final  . GFR calc non Af Amer 05/07/2012 >90  >90 mL/min Final  . GFR calc Af Amer 05/07/2012 >90  >90 mL/min Final   Comment:                                 The eGFR has been calculated                          using the CKD EPI equation.                          This calculation has not been                          validated in all clinical                          situations.                          eGFR's persistently                          <90 mL/min signify                          possible Chronic Kidney Disease.  Hospital Outpatient  Visit on 04/26/2012  Component Date Value Range Status  . aPTT 04/26/2012 31  24 - 37 seconds Final  . WBC 04/26/2012 4.8  4.0 - 10.5 K/uL Final  . RBC 04/26/2012 5.22  4.22 - 5.81 MIL/uL Final  . Hemoglobin 04/26/2012 16.3  13.0 - 17.0 g/dL Final  . HCT 84/13/2440 45.7  39.0 - 52.0 % Final  . MCV 04/26/2012 87.5  78.0 - 100.0 fL Final  . MCH 04/26/2012 31.2  26.0 - 34.0 pg Final  . MCHC 04/26/2012 35.7  30.0 - 36.0 g/dL Final  . RDW 12/07/2534 12.4  11.5 - 15.5 % Final  . Platelets 04/26/2012 255  150 -  400 K/uL Final  . Sodium 04/26/2012 141  135 - 145 mEq/L Final  . Potassium 04/26/2012 4.1  3.5 - 5.1 mEq/L Final  . Chloride 04/26/2012 105  96 - 112 mEq/L Final  . CO2 04/26/2012 26  19 - 32 mEq/L Final  . Glucose, Bld 04/26/2012 93  70 - 99 mg/dL Final  . BUN 96/05/5407 16  6 - 23 mg/dL Final  . Creatinine, Ser 04/26/2012 0.93  0.50 - 1.35 mg/dL Final  . Calcium 81/19/1478 10.5  8.4 - 10.5 mg/dL Final  . Total Protein 04/26/2012 7.5  6.0 - 8.3 g/dL Final  . Albumin 29/56/2130 4.2  3.5 - 5.2 g/dL Final  . AST 86/57/8469 22  0 - 37 U/L Final  . ALT 04/26/2012 24  0 - 53 U/L Final  . Alkaline Phosphatase 04/26/2012 68  39 - 117 U/L Final  . Total Bilirubin 04/26/2012 1.1  0.3 - 1.2 mg/dL Final  . GFR calc non Af Amer 04/26/2012 >90  >90 mL/min Final  . GFR calc Af Amer 04/26/2012 >90  >90 mL/min Final   Comment:                                 The eGFR has been calculated                          using the CKD EPI equation.                          This calculation has not been                          validated in all clinical                          situations.                          eGFR's persistently                          <90 mL/min signify                          possible Chronic Kidney Disease.  Marland Kitchen Prothrombin Time 04/26/2012 12.6  11.6 - 15.2 seconds Final  . INR 04/26/2012 0.95  0.00 - 1.49 Final  . Color, Urine 04/26/2012 YELLOW  YELLOW Final  . APPearance  04/26/2012 CLEAR  CLEAR Final  . Specific Gravity, Urine 04/26/2012 1.005  1.005 - 1.030 Final  . pH 04/26/2012 5.0  5.0 - 8.0 Final  . Glucose, UA 04/26/2012 NEGATIVE  NEGATIVE mg/dL Final  . Hgb urine dipstick 04/26/2012 NEGATIVE  NEGATIVE Final  . Bilirubin Urine 04/26/2012 NEGATIVE  NEGATIVE Final  . Ketones, ur 04/26/2012 NEGATIVE  NEGATIVE mg/dL Final  . Protein, ur 62/95/2841 NEGATIVE  NEGATIVE mg/dL Final  . Urobilinogen, UA 04/26/2012 0.2  0.0 - 1.0 mg/dL Final  . Nitrite 32/44/0102 NEGATIVE  NEGATIVE Final  . Leukocytes, UA 04/26/2012 NEGATIVE  NEGATIVE Final   MICROSCOPIC NOT DONE ON URINES WITH NEGATIVE PROTEIN, BLOOD, LEUKOCYTES, NITRITE, OR GLUCOSE <1000 mg/dL.  Marland Kitchen MRSA, PCR 04/26/2012 NEGATIVE  NEGATIVE Final  . Staphylococcus aureus 04/26/2012 POSITIVE* NEGATIVE Final  Comment:                                 The Xpert SA Assay (FDA                          approved for NASAL specimens                          in patients over 45 years of age),                          is one component of                          a comprehensive surveillance                          program.  Test performance has                          been validated by [employer] for patients greater                          than or equal to 31 year old.                          It is not intended                          to diagnose infection nor to                          guide or monitor treatment.     X-Rays:Dg Hip Complete Left  04/26/2012  *RADIOLOGY REPORT*  Clinical Data: Left hip pain.  Preoperative films.  LEFT HIP - COMPLETE 2+ VIEW  Comparison: None.  Findings: The patient has severe left hip osteoarthritis with bone- on-bone joint space narrowing, subchondral sclerosis and osteophytosis.  Mild to moderate right hip degenerative change is present.  There is no fracture or dislocation.  IMPRESSION: Advanced left hip osteoarthritis.   Original Report Authenticated  By: Holley Dexter, M.D.    Dg Pelvis Portable  05/05/2012  *RADIOLOGY REPORT*  Clinical Data: Status post left hip arthroplasty.  PORTABLE PELVIS  Comparison: 04/26/2012  Findings: After total hip arthroplasty, components show normal alignment in the frontal projection.  A lateral surgical drain is present.  No fracture or soft tissue abnormality is identified.  IMPRESSION: Normal alignment of left hip arthroplasty.   Original Report Authenticated By: Irish Lack, M.D.    Dg C-arm 1-60 Min-no Report  05/05/2012  CLINICAL DATA: Surgery   C-ARM 1-60 MINUTES  Fluoroscopy was utilized by the requesting physician.  No radiographic  interpretation.      EKG: Orders placed in visit on 09/02/10  . EKG     Hospital Course: Patient was admitted to [facility] and taken to the OR and underwent the above state procedure without complications.  Patient tolerated the procedure well and  was later transferred to the recovery room and then to the orthopaedic floor for postoperative care.  They were given PO and IV analgesics for pain control following their surgery.  They were given 24 hours of postoperative antibiotics of  Anti-infectives   Start     Dose/Rate Route Frequency Ordered Stop   05/05/12 1700  ceFAZolin (ANCEF) IVPB 1 g/50 mL premix     1 g 100 mL/hr over 30 Minutes Intravenous Every 6 hours 05/05/12 1454 05/05/12 2348   05/05/12 0800  ceFAZolin (ANCEF) IVPB 2 g/50 mL premix     2 g 100 mL/hr over 30 Minutes Intravenous On call to O.R. 05/05/12 0745 05/05/12 1022     and started on DVT prophylaxis in the form of Xarelto.   PT and OT were ordered for total hip protocol.  The patient was allowed to be WBAT with therapy. Discharge planning was consulted to help with postop disposition and equipment needs.  Patient had a decent night on the evening of surgery.  They started to get up OOB with therapy on day one walking over 180 feet.  Hemovac drain was pulled without difficulty.  The  knee immobilizer was removed and discontinued.  Continued to work with therapy into day two.  Dressing was changed on day two and the incision was healing well.  Patient was seen in rounds and was ready to go home later that same day.  Discharge Medications: Prior to Admission medications   Medication Sig Start Date End Date Taking? Authorizing Provider  Avanafil (STENDRA) 200 MG TABS Take 200 mg by mouth as needed. Patient states Dr Laverle Patter gave patient samples of these .  Has not used as of preop appointment on 04/26/12.    Historical Provider, MD  methocarbamol (ROBAXIN) 500 MG tablet Take 1 tablet (500 mg total) by mouth every 6 (six) hours as needed. 05/07/12   Aahna Rossa, PA-C  oxyCODONE (OXY IR/ROXICODONE) 5 MG immediate release tablet Take 1-2 tablets (5-10 mg total) by mouth every 3 (three) hours as needed. 05/07/12   Maili Shutters Julien Girt, PA-C  rivaroxaban (XARELTO) 10 MG TABS tablet Take 1 tablet (10 mg total) by mouth daily with breakfast. Take Xarelto for two and a half more weeks, then discontinue Xarelto. Once the patient has completed the Xarelto, they may resume the 325 mg Aspirin. 05/07/12   Donette Mainwaring, PA-C  rosuvastatin (CRESTOR) 10 MG tablet Take 10 mg by mouth daily before breakfast.    Historical Provider, MD  traMADol (ULTRAM) 50 MG tablet Take 1-2 tablets (50-100 mg total) by mouth every 6 (six) hours as needed. 05/07/12   Jerrod Damiano Julien Girt, PA-C    Diet: Regular diet Activity:WBAT Follow-up:in 2 weeks Disposition - Home Discharged Condition: good   Discharge Orders   Future Orders Complete By Expires     Call MD / Call 911  As directed     Comments:      If you experience chest pain or shortness of breath, CALL 911 and be transported to the hospital emergency room.  If you develope a fever above 101 F, pus (white drainage) or increased drainage or redness at the wound, or calf pain, call your surgeon's office.    Change dressing  As directed      Comments:      You may change your dressing dressing daily with sterile 4 x 4 inch gauze dressing and paper tape.  Do not submerge the incision under water.    Constipation Prevention  As  directed     Comments:      Drink plenty of fluids.  Prune juice may be helpful.  You may use a stool softener, such as Colace (over the counter) 100 mg twice a day.  Use MiraLax (over the counter) for constipation as needed.    Diet - low sodium heart healthy  As directed     Discharge instructions  As directed     Comments:      Pick up stool softner and laxative for home. Do not submerge incision under water. May shower. Continue to use ice for pain and swelling from surgery. Hip precautions.  Total Hip Protocol.  Take Xarelto for two and a half more weeks, then discontinue Xarelto. Once the patient has completed the Xarelto, they may resume the 325 mg Aspirin.    Do not sit on low chairs, stoools or toilet seats, as it may be difficult to get up from low surfaces  As directed     Driving restrictions  As directed     Comments:      No driving until released by the physician.    Increase activity slowly as tolerated  As directed     Lifting restrictions  As directed     Comments:      No lifting until released by the physician.    Patient may shower  As directed     Comments:      You may shower without a dressing once there is no drainage.  Do not wash over the wound.  If drainage remains, do not shower until drainage stops.    TED hose  As directed     Comments:      Use stockings (TED hose) for 3 weeks on both leg(s).  You may remove them at night for sleeping.    Weight bearing as tolerated  As directed         Medication List    STOP taking these medications       aspirin 325 MG tablet     Fish Oil 1200 MG Caps     glucosamine-chondroitin 500-400 MG tablet     ibuprofen 200 MG tablet  Commonly known as:  ADVIL,MOTRIN     multivitamin with minerals Tabs     naproxen sodium  220 MG tablet  Commonly known as:  ANAPROX     tadalafil 5 MG tablet  Commonly known as:  CIALIS     vardenafil 20 MG tablet  Commonly known as:  LEVITRA      TAKE these medications       methocarbamol 500 MG tablet  Commonly known as:  ROBAXIN  Take 1 tablet (500 mg total) by mouth every 6 (six) hours as needed.     oxyCODONE 5 MG immediate release tablet  Commonly known as:  Oxy IR/ROXICODONE  Take 1-2 tablets (5-10 mg total) by mouth every 3 (three) hours as needed.     rivaroxaban 10 MG Tabs tablet  Commonly known as:  XARELTO  Take 1 tablet (10 mg total) by mouth daily with breakfast. Take Xarelto for two and a half more weeks, then discontinue Xarelto.  Once the patient has completed the Xarelto, they may resume the 325 mg Aspirin.     rosuvastatin 10 MG tablet  Commonly known as:  CRESTOR  Take 10 mg by mouth daily before breakfast.     STENDRA 200 MG Tabs  Generic drug:  Avanafil  Take 200 mg by mouth  as needed. Patient states Dr Laverle Patter gave patient samples of these .  Has not used as of preop appointment on 04/26/12.     traMADol 50 MG tablet  Commonly known as:  ULTRAM  Take 1-2 tablets (50-100 mg total) by mouth every 6 (six) hours as needed.           Follow-up Information   Follow up with Loanne Drilling, MD. Schedule an appointment as soon as possible for a visit on 05/20/2012.   Contact information:   [address], [address] [place] [address] [phone]       Signed: Patrica Duel 05/07/2012, 9:14 AM

## 2012-05-07 NOTE — Progress Notes (Signed)
Physical Therapy Treatment Patient Details Name: Scott Rodgers MRN: [id number] DOB: Oct 16, 1960 Today's Date: 05/07/2012 Time: 4098-1191 PT Time Calculation (min): 23 min  PT Assessment / Plan / Recommendation Comments on Treatment Session  Continuing to progress well! Plan is for d/c today after 2nd session. Recommend HHPT.     Follow Up Recommendations  Home health PT     Does the patient have the potential to tolerate intense rehabilitation     Barriers to Discharge        Equipment Recommendations  None recommended by PT    Recommendations for Other Services    Frequency 7X/week   Plan Discharge plan remains appropriate    Precautions / Restrictions Precautions Precautions: None Restrictions Weight Bearing Restrictions: No RLE Weight Bearing: Weight bearing as tolerated   Pertinent Vitals/Pain 2/10 R hip/thigh-spasms/soreness    Mobility  Bed Mobility Bed Mobility: Sit to Supine;Supine to Sit Supine to Sit: 6: Modified independent (Device/Increase time) Sit to Supine: 6: Modified independent (Device/Increase time) Details for Bed Mobility Assistance: Pt used leg lifter.  Transfers Transfers: Sit to Stand;Stand to Sit Sit to Stand: 6: Modified independent (Device/Increase time);From bed Stand to Sit: 6: Modified independent (Device/Increase time);To bed Ambulation/Gait Ambulation/Gait Assistance: 6: Modified independent (Device/Increase time) Ambulation Distance (Feet): 350 Feet Assistive device: Rolling walker Ambulation/Gait Assistance Details: Slow gait speed. Using reciprocal gait pattern now  Gait Pattern: Step-through pattern;Decreased stride length Stairs: No    Exercises Total Joint Exercises Hip ABduction/ADduction: AROM;Left;10 reps;Standing Knee Flexion: AROM;Left;10 reps;Standing Marching in Standing: AROM;Both;10 reps;Standing Standing Hip Extension: AROM;Left;10 reps;Standing General Exercises - Lower Extremity Heel Raises: AROM;Both;10  reps;Standing   PT Diagnosis:    PT Problem List:   PT Treatment Interventions:     PT Goals Acute Rehab PT Goals Pt will go Supine/Side to Sit: with supervision PT Goal: Supine/Side to Sit - Progress: Met Pt will go Sit to Supine/Side: with supervision PT Goal: Sit to Supine/Side - Progress: Met Pt will go Sit to Stand: with supervision Pt will Ambulate: >150 feet;with supervision;with rolling walker PT Goal: Ambulate - Progress: Met Pt will Perform Home Exercise Program: with supervision, verbal cues required/provided PT Goal: Perform Home Exercise Program - Progress: Progressing toward goal  Visit Information  Last PT Received On: 05/07/12 Assistance Needed: +1    Subjective Data  Subjective: It gets stiff quickly Patient Stated Goal: home. regain independence   Cognition  Cognition Overall Cognitive Status: Appears within functional limits for tasks assessed/performed Arousal/Alertness: Awake/alert Orientation Level: Appears intact for tasks assessed Behavior During Session: [facility] for tasks performed    Balance     End of Session PT - End of Session Activity Tolerance: Patient tolerated treatment well Patient left: in bed;with call bell/phone within reach   GP     Rebeca Alert, MPT Pager: [phone]

## 2012-05-07 NOTE — Progress Notes (Signed)
   Subjective: 2 Days Post-Op Procedure(s) (LRB): TOTAL HIP ARTHROPLASTY ANTERIOR APPROACH (Left) Patient reports pain as mild.   Patient seen in rounds with Dr. Lequita Halt. Patient is well, but has had some minor complaints of pain in the hip, requiring pain medications Patient is ready to go home today.  Objective: Vital signs in last 24 hours: Temp:  [98.3 F (36.8 C)-98.6 F (37 C)] 98.5 F (36.9 C) (03/28 0430) Pulse Rate:  [62-82] 77 (03/28 0430) Resp:  [16-18] 16 (03/28 0430) BP: (113-130)/(68-87) 130/79 mmHg (03/28 0430) SpO2:  [94 %-100 %] 97 % (03/28 0430)  Intake/Output from previous day:  Intake/Output Summary (Last 24 hours) at 05/07/12 1914 Last data filed at 05/07/12 0756  Gross per 24 hour  Intake   1140 ml  Output    700 ml  Net    440 ml    Intake/Output this shift: Total I/O In: 360 [P.O.:360] Out: -   Labs:  Recent Labs  05/06/12 0450 05/07/12 0433  HGB 12.5* 11.3*    Recent Labs  05/06/12 0450 05/07/12 0433  WBC 8.1 9.1  RBC 4.03* 3.62*  HCT 35.6* 31.8*  PLT 176 182    Recent Labs  05/06/12 0450 05/07/12 0433  NA 138 139  K 4.0 3.8  CL 104 103  CO2 31 26  BUN 10 11  CREATININE 0.97 0.82  GLUCOSE 112* 107*  CALCIUM 8.4 8.9   No results found for this basename: LABPT, INR,  in the last 72 hours  EXAM: General - Patient is Alert, Appropriate and Oriented Extremity - Neurovascular intact Sensation intact distally Dorsiflexion/Plantar flexion intact No cellulitis present Incision - clean, dry, no drainage, healing Motor Function - intact, moving foot and toes well on exam.   Assessment/Plan: 2 Days Post-Op Procedure(s) (LRB): TOTAL HIP ARTHROPLASTY ANTERIOR APPROACH (Left) Procedure(s) (LRB): TOTAL HIP ARTHROPLASTY ANTERIOR APPROACH (Left) Past Medical History  Diagnosis Date  . Cancer     prostate cancer- 2012  . Arthritis    Principal Problem:   OA (osteoarthritis) of hip  Estimated body mass index is 28.2  kg/(m^2) as calculated from the following:   Height as of this encounter: 6' (1.829 m).   Weight as of this encounter: 94.348 kg (208 lb). Up with therapy Discharge home with home health Diet - Regular diet Follow up - in 2 weeks Activity - WBAT Disposition - Home Condition Upon Discharge - Good D/C Meds - See DC Summary DVT Prophylaxis - Xarelto  PERKINS, ALEXZANDREW 05/07/2012, 8:12 AM

## 2012-05-07 NOTE — Evaluation (Signed)
Occupational Therapy Evaluation and Discharge Summary Patient Details Name: Scott Rodgers MRN: [id number] DOB: 24-Jul-1960 Today's Date: 05/07/2012 Time: 1191-4782 OT Time Calculation (min): 22 min  OT Assessment / Plan / Recommendation Clinical Impression  Pt is a 52 yo male admitted for L THA (anterior approach) who is almost I with adls 2 days post op.  Pt has assist at home as needed and is not in need of further OT.    OT Assessment  Patient does not need any further OT services    Follow Up Recommendations  No OT follow up    Barriers to Discharge      Equipment Recommendations  None recommended by OT    Recommendations for Other Services    Frequency       Precautions / Restrictions Precautions Precautions: None Restrictions Weight Bearing Restrictions: No RLE Weight Bearing: Weight bearing as tolerated   Pertinent Vitals/Pain Pt with no pain.    ADL  Eating/Feeding: Performed;Independent Where Assessed - Eating/Feeding: Chair Grooming: Performed;Wash/dry hands;Wash/dry face;Teeth care;Modified independent Where Assessed - Grooming: Supported standing Upper Body Bathing: Simulated;Set up Where Assessed - Upper Body Bathing: Unsupported sitting Lower Body Bathing: Simulated;Minimal assistance Where Assessed - Lower Body Bathing: Supported sit to stand Upper Body Dressing: Performed;Set up Where Assessed - Upper Body Dressing: Unsupported sitting Lower Body Dressing: Performed;Minimal assistance Where Assessed - Lower Body Dressing: Supported sit to stand Toilet Transfer: Research scientist (life sciences) Method: Sit to stand;Stand Wellsite geologist: Raised toilet seat with arms (or 3-in-1 over toilet) Toileting - Clothing Manipulation and Hygiene: Performed;Modified independent Where Assessed - Toileting Clothing Manipulation and Hygiene: Standing Tub/Shower Transfer: Engineer, manufacturing Method:  Science writer: Walk in shower;Other (comment) (practiced tub and walk in shower transfers.) Equipment Used: Leg lifter;Rolling walker Transfers/Ambulation Related to ADLs: S with all mobility in room ADL Comments: only needs help donning L sock and shoe.    OT Diagnosis:    OT Problem List:   OT Treatment Interventions:     OT Goals    Visit Information  Last OT Received On: 05/07/12 Assistance Needed: +1    Subjective Data  Subjective: "I am going home today." Patient Stated Goal: to be I again.   Prior Functioning     Home Living Lives With: Spouse Available Help at Discharge: Family Type of Home: House Home Access: Stairs to enter [employer] of Steps: 2 Entrance Stairs-Rails: None Home Layout: Two level;Able to live on main level with bedroom/bathroom Bathroom Shower/Tub: Engineer, manufacturing systems: Standard Home Adaptive Equipment: Bedside commode/3-in-1;Sock aid;Walker - rolling;Straight cane Additional Comments: leg lifter Prior Function Level of Independence: Independent Able to Take Stairs?: Yes Driving: Yes Vocation: Full time employment Comments: works at [employer] in [employer].  commutes 9 hours each way every week. Communication Communication: No difficulties Dominant Hand: Right         Vision/Perception Vision - History Baseline Vision: No visual deficits Vision - Assessment Vision Assessment: Vision not tested   Cognition  Cognition Overall Cognitive Status: Appears within functional limits for tasks assessed/performed Arousal/Alertness: Awake/alert Orientation Level: Oriented X4 / Intact Behavior During Session: [facility] for tasks performed    Extremity/Trunk Assessment Right Upper Extremity Assessment RUE ROM/Strength/Tone: Within functional levels RUE Sensation: WFL - Light Touch RUE Coordination: WFL - gross/fine motor Left Upper Extremity Assessment LUE ROM/Strength/Tone: Within functional  levels LUE Sensation: WFL - Light Touch LUE Coordination: WFL - gross/fine motor Trunk Assessment Trunk Assessment: Normal     Mobility  Bed Mobility Bed Mobility: Supine to Sit;Sit to Supine Supine to Sit: 6: Modified independent (Device/Increase time) Sit to Supine: 6: Modified independent (Device/Increase time) Details for Bed Mobility Assistance: Pt used leg lifter. VCS safety, technique Transfers Transfers: Sit to Stand;Stand to Sit Sit to Stand: 6: Modified independent (Device/Increase time) Stand to Sit: 6: Modified independent (Device/Increase time)     Exercise     Balance     End of Session OT - End of Session Activity Tolerance: Patient tolerated treatment well Patient left: in bed;with call bell/phone within reach Nurse Communication: Mobility status  GO     Hope Budds 05/07/2012, 9:43 AM [phone]

## 2012-05-07 NOTE — Progress Notes (Signed)
Physical Therapy Treatment Patient Details Name: Scott Rodgers MRN: [id number] DOB: 08/24/1960 Today's Date: 05/07/2012 Time: 4098-1191 PT Time Calculation (min): 14 min  PT Assessment / Plan / Recommendation Comments on Treatment Session  2nd session-practiced steps with wife present. All education completed. Discussed car transfer. Recommend HHPT.    Follow Up Recommendations  Home health PT     Does the patient have the potential to tolerate intense rehabilitation     Barriers to Discharge        Equipment Recommendations  None recommended by PT    Recommendations for Other Services    Frequency 7X/week   Plan Discharge plan remains appropriate    Precautions / Restrictions Precautions Precautions: None Restrictions Weight Bearing Restrictions: No RLE Weight Bearing: Weight bearing as tolerated   Pertinent Vitals/Pain R hip-sore/stiff    Mobility  Transfers Transfers: Sit to Stand;Stand to Sit Sit to Stand: 6: Modified independent (Device/Increase time);From chair/3-in-1 Stand to Sit: 6: Modified independent (Device/Increase time);To bed Ambulation/Gait Ambulation/Gait Assistance: 6: Modified independent (Device/Increase time) Ambulation Distance (Feet): 150 Feet Assistive device: Rolling walker Ambulation/Gait Assistance Details: Slow gait speed. Using reciprocal gait pattern now  Gait Pattern: Step-through pattern;Decreased stride length Stairs: Yes Stairs Assistance: 4: Min assist Stairs Assistance Details (indicate cue type and reason): 1 HHA for support for ascending/descending steps. Wife present. VCs safety, technique.  Stair Management Technique: Forwards;Step to pattern Number of Stairs: 2    Exercises    PT Diagnosis:    PT Problem List:   PT Treatment Interventions:     PT Goals Acute Rehab PT Goals Pt will go Supine/Side to Sit: with supervision PT Goal: Supine/Side to Sit - Progress: Met Pt will go Sit to Supine/Side: with supervision PT  Goal: Sit to Supine/Side - Progress: Met Pt will go Sit to Stand: with supervision Pt will Ambulate: >150 feet;with supervision;with rolling walker PT Goal: Ambulate - Progress: Met Pt will Go Up / Down Stairs: 1-2 stairs;with min assist;with least restrictive assistive device PT Goal: Up/Down Stairs - Progress: Met Pt will Perform Home Exercise Program: with supervision, verbal cues required/provided PT Goal: Perform Home Exercise Program - Progress: Progressing toward goal  Visit Information  Last PT Received On: 05/07/12 Assistance Needed: +1    Subjective Data  Subjective: It gets stiff quickly Patient Stated Goal: home. regain independence   Cognition  Cognition Overall Cognitive Status: Appears within functional limits for tasks assessed/performed Arousal/Alertness: Awake/alert Orientation Level: Appears intact for tasks assessed Behavior During Session: [facility] for tasks performed    Balance     End of Session PT - End of Session Activity Tolerance: Patient tolerated treatment well Patient left: in chair;with call bell/phone within reach;with family/visitor present   GP     Rebeca Alert, MPT Pager: [phone]

## 2012-12-29 IMAGING — CR DG CHEST 2V
2 series · 2 of 2 positions shown · non-contrast
Comparison: 02/01/2007

CLINICAL DATA: Preop, prostate cancer

CHEST - 2 VIEW

[w chest pa]
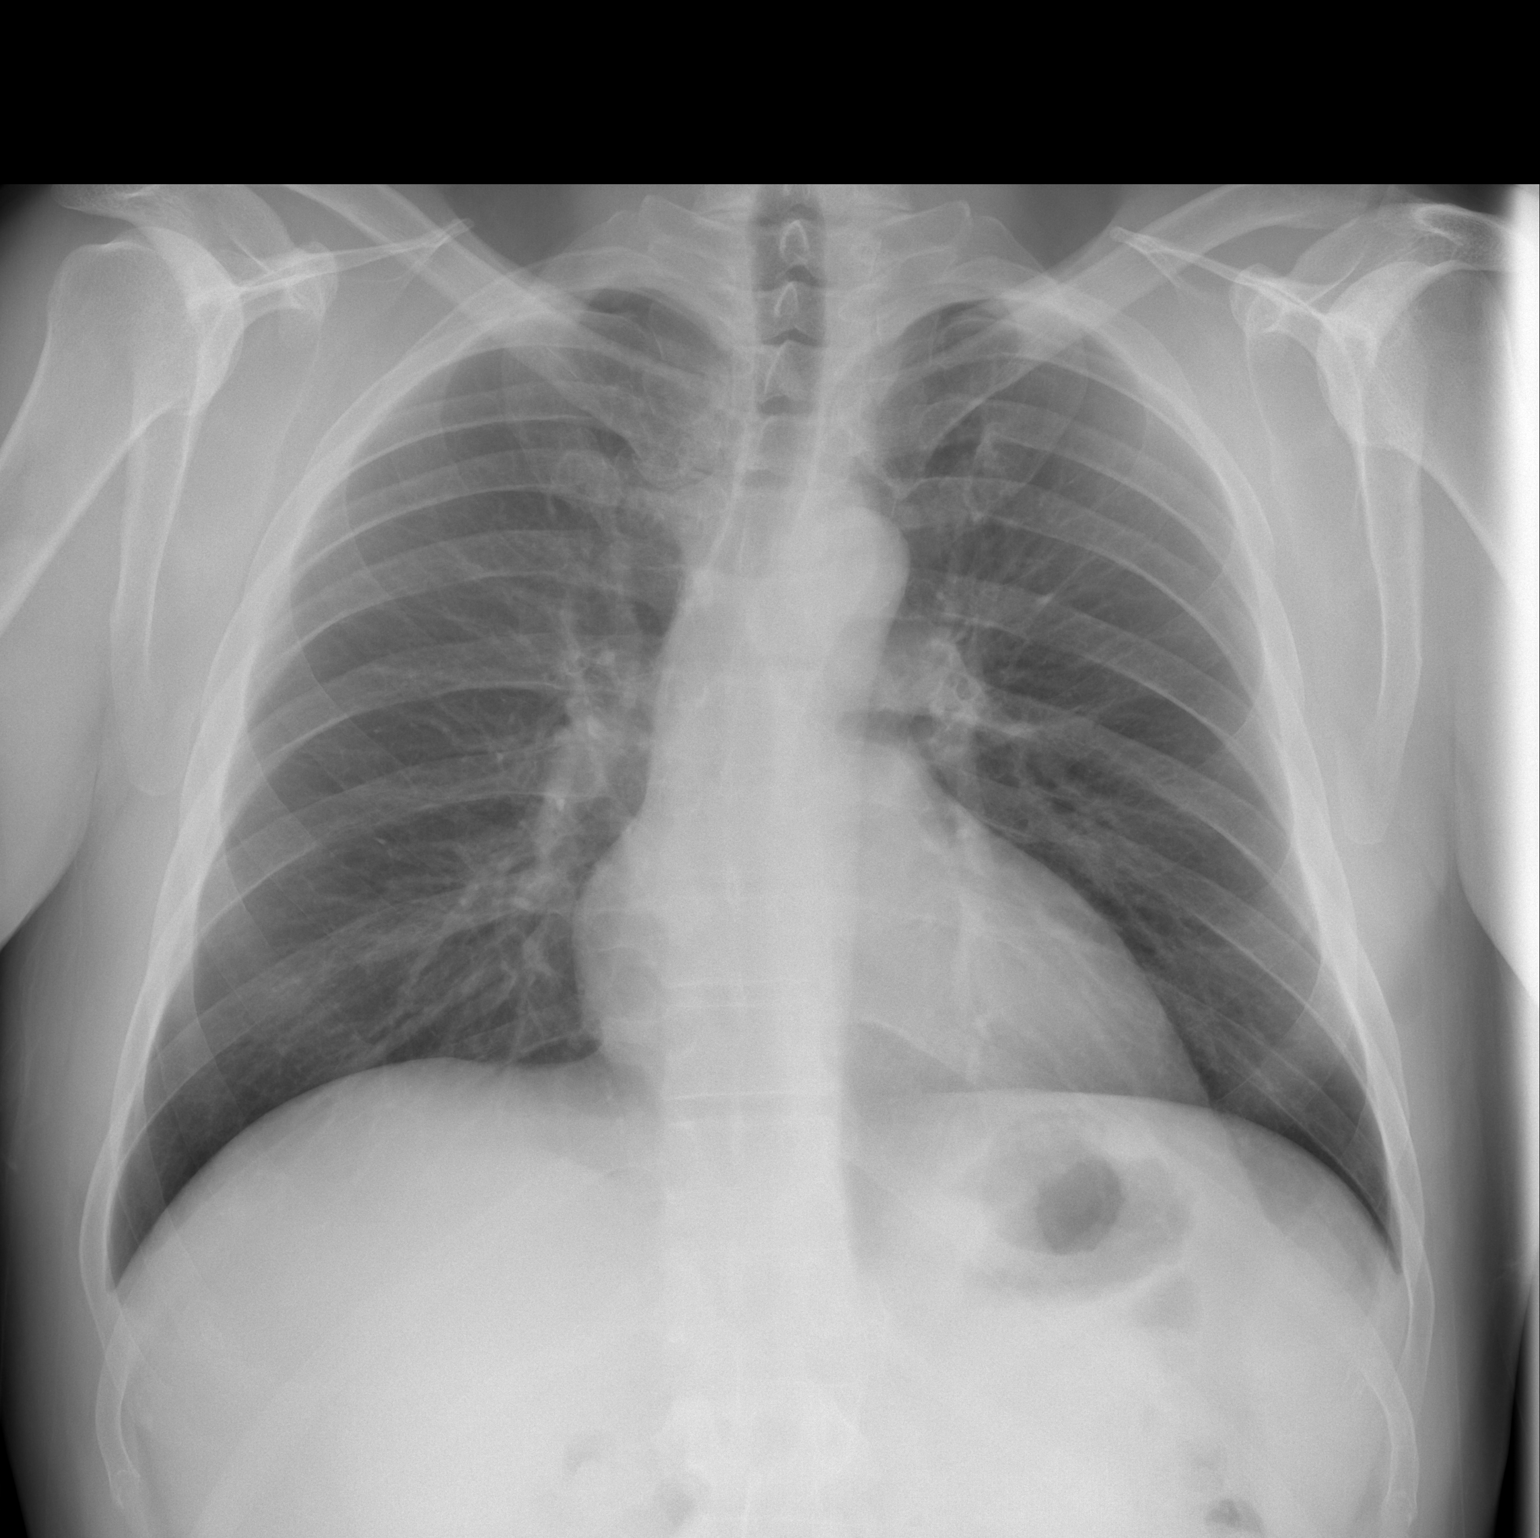

[w chest lat]
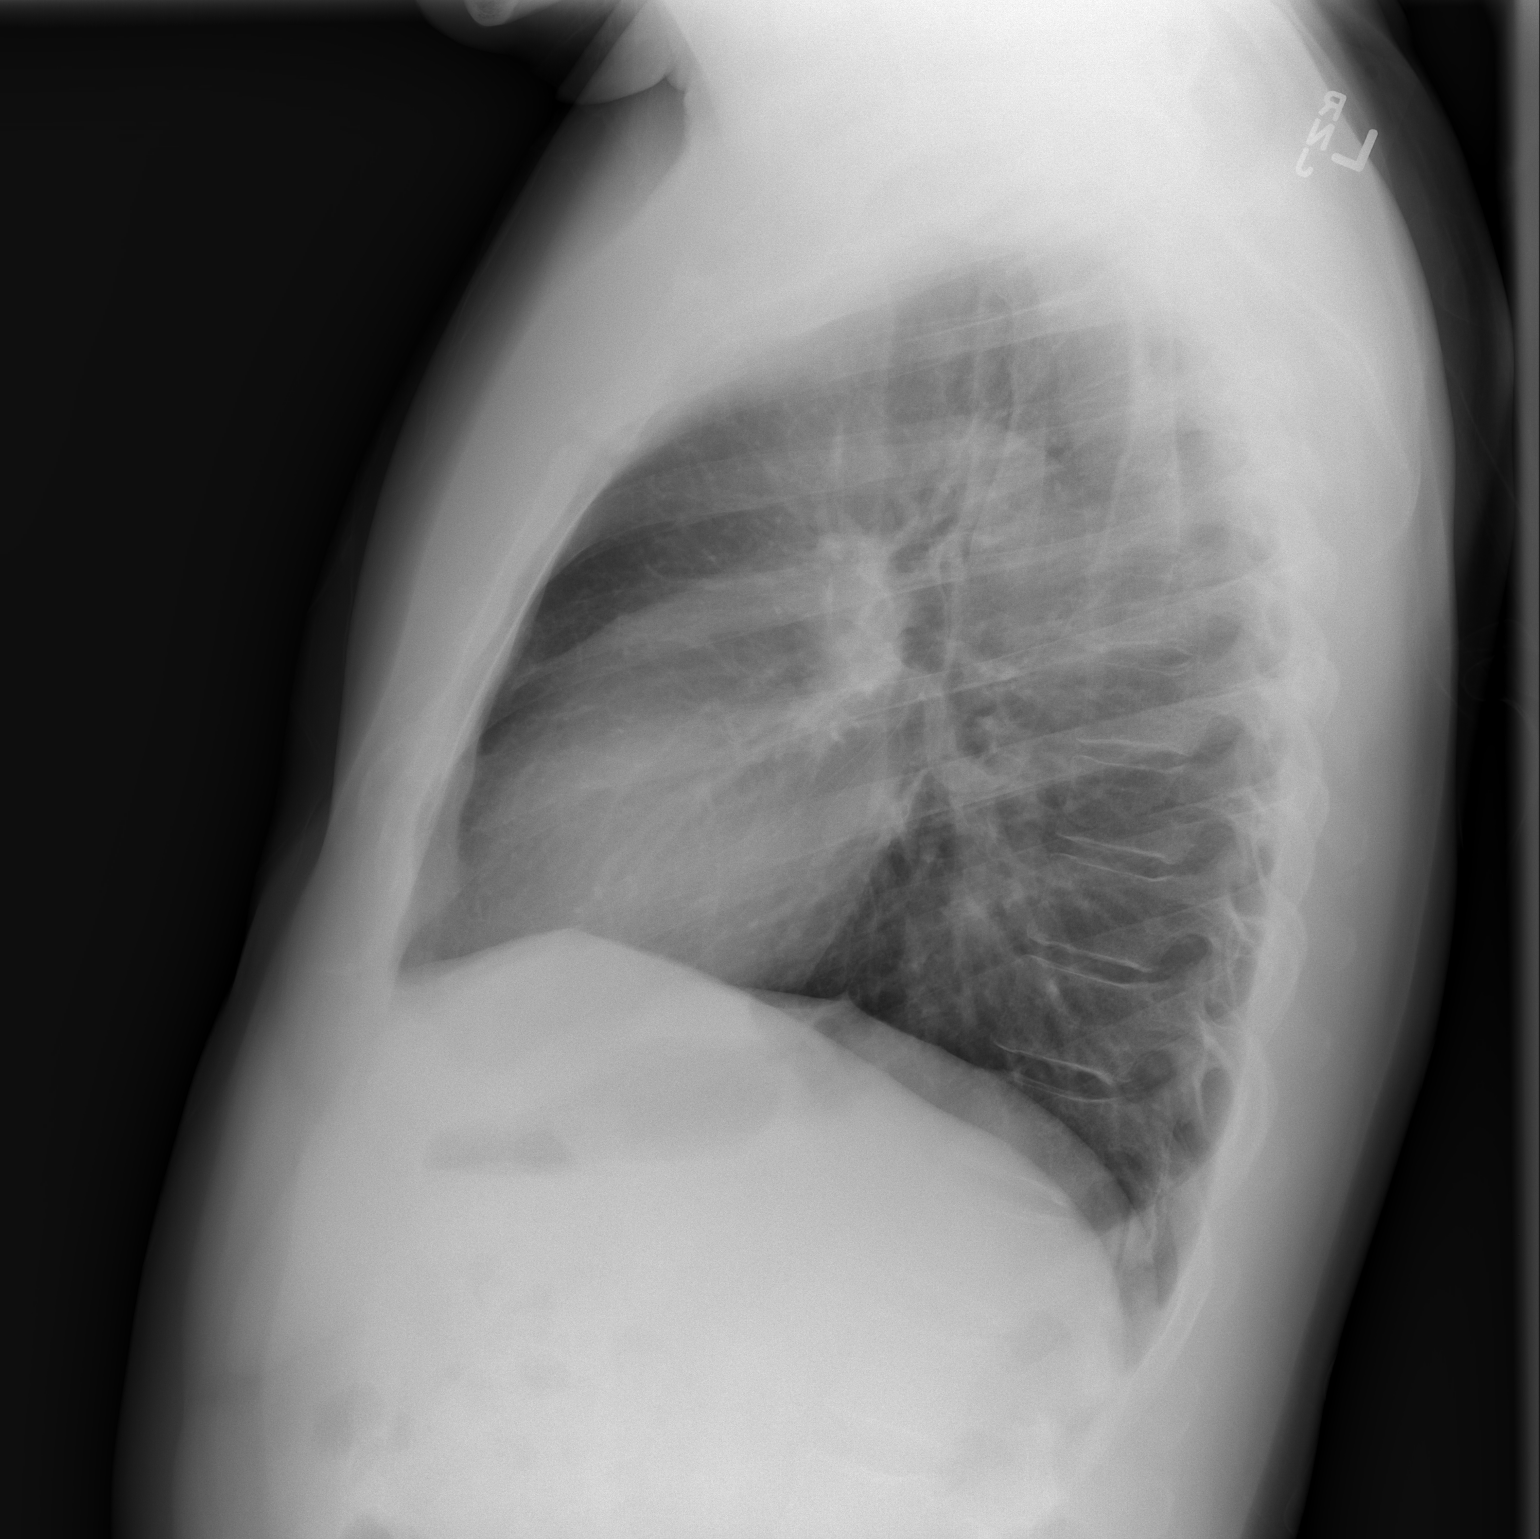

[2 of 2 positions shown; findings below may reference images not displayed]

FINDINGS: Lungs are clear. No pleural effusion or pneumothorax.

Cardiomediastinal silhouette is within normal limits.

Visualized osseous structures are within normal limits.
IMPRESSION: Normal chest radiographs.

## 2013-05-27 LAB — CBC AND DIFFERENTIAL
HCT: 45 % (ref 41–53)
Hemoglobin: 15.9 g/dL (ref 13.5–17.5)
Platelets: 230 K/µL (ref 150–399)
WBC: 4.3 10*3/mL

## 2013-05-27 LAB — BASIC METABOLIC PANEL WITH GFR
BUN: 23 mg/dL — AB (ref 4–21)
Creatinine: 1 mg/dL (ref 0.6–1.3)
Glucose: 97 mg/dL
Potassium: 4.8 mmol/L (ref 3.4–5.3)
Sodium: 143 mmol/L (ref 137–147)

## 2013-05-27 LAB — HEPATIC FUNCTION PANEL
ALT: 21 U/L (ref 10–40)
AST: 22 U/L (ref 14–40)
Bilirubin, Total: 0.8 mg/dL

## 2013-05-27 LAB — LIPID PANEL
Cholesterol: 205 mg/dL — AB (ref 0–200)
HDL: 71 mg/dL — AB (ref 35–70)
LDL Cholesterol: 108 mg/dL
Triglycerides: 128 mg/dL (ref 40–160)

## 2013-05-27 LAB — TSH: TSH: 1 u[IU]/mL (ref 0.41–5.90)

## 2013-05-27 LAB — PSA: PSA: 0.01

## 2014-09-01 IMAGING — CR DG PORTABLE PELVIS
1 series · 1 of 1 positions shown · non-contrast
Comparison: 04/26/2012

CLINICAL DATA: Status post left hip arthroplasty.

PORTABLE PELVIS

[AP]
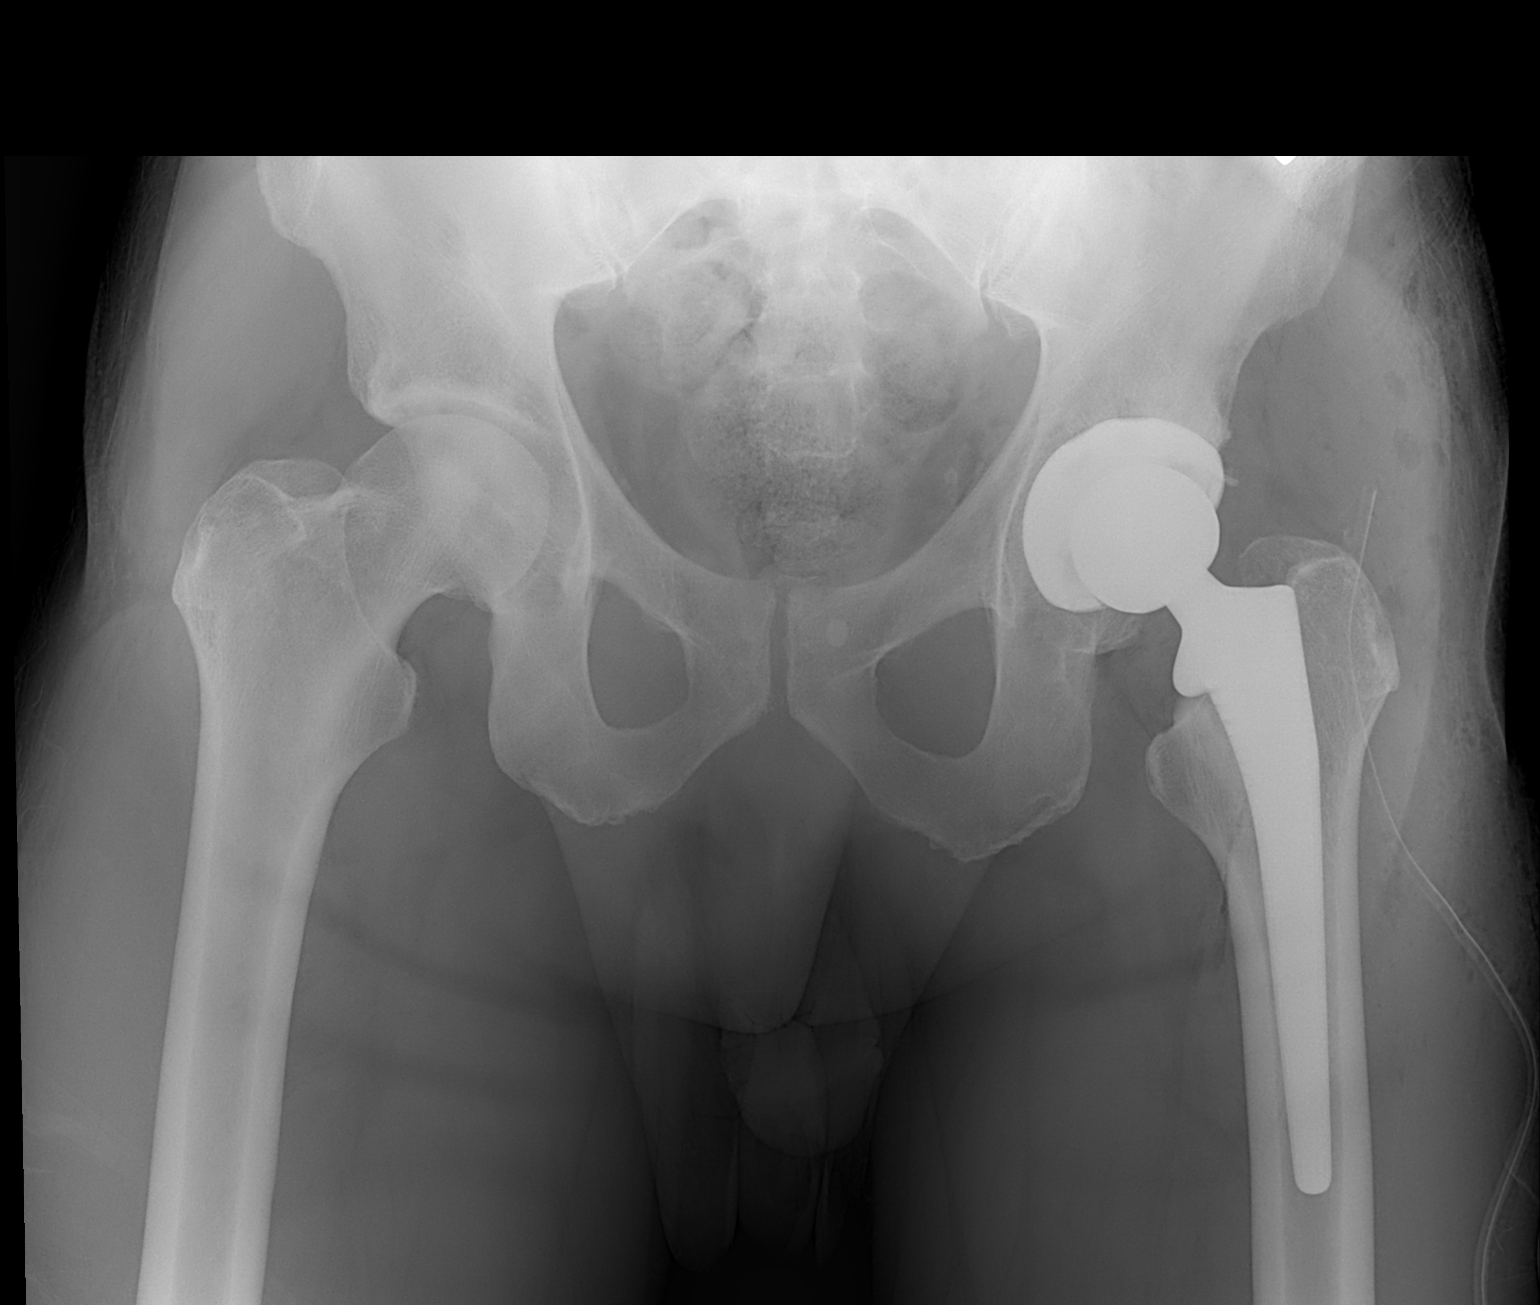

[1 of 1 positions shown; findings below may reference images not displayed]

FINDINGS: After total hip arthroplasty, components show normal
alignment in the frontal projection.  A lateral surgical drain is
present.  No fracture or soft tissue abnormality is identified.
IMPRESSION: Normal alignment of left hip arthroplasty.

## 2016-01-17 ENCOUNTER — Encounter: Payer: Self-pay | Admitting: Family Medicine

## 2016-02-18 ENCOUNTER — Ambulatory Visit (INDEPENDENT_AMBULATORY_CARE_PROVIDER_SITE_OTHER): Payer: BLUE CROSS/BLUE SHIELD | Admitting: Family Medicine

## 2016-02-18 ENCOUNTER — Encounter: Payer: Self-pay | Admitting: Family Medicine

## 2016-02-18 VITALS — BP 128/90 | HR 97 | Temp 98.2°F | Ht 69.75 in | Wt 211.6 lb

## 2016-02-18 DIAGNOSIS — C61 Malignant neoplasm of prostate: Secondary | ICD-10-CM

## 2016-02-18 DIAGNOSIS — R030 Elevated blood-pressure reading, without diagnosis of hypertension: Secondary | ICD-10-CM | POA: Diagnosis not present

## 2016-02-18 DIAGNOSIS — M169 Osteoarthritis of hip, unspecified: Secondary | ICD-10-CM | POA: Diagnosis not present

## 2016-02-18 DIAGNOSIS — E785 Hyperlipidemia, unspecified: Secondary | ICD-10-CM | POA: Diagnosis not present

## 2016-02-18 MED ORDER — ATORVASTATIN CALCIUM 10 MG PO TABS: 10.0000 mg | ORAL_TABLET | Freq: Every day | ORAL | 3 refills | Status: DC

## 2016-02-18 NOTE — Progress Notes (Signed)
Subjective:     Patient ID: Scott Rodgers, male   DOB: 1960-04-08, 56 y.o.   MRN: [id number]  HPI Patient seen to establish care. He has previously seen Dr. Tollie Rodgers with [facility]. I have seen the rest of his family for several years. Patient has history of prostate cancer which was diagnosed 2012 and he had robotic prostatectomy and gets yearly follow-up with urology. His PSAs have been 0. He has history osteoarthritis left hip and had total hip replacement April 2014. He exercises several days per week. Never smoked. Drinks about 12 ounces of wine per day.  He has hyperlipidemia treated with atorvastatin 10 mg daily. No history of CAD. His father had aortic insufficiency but no known history of CAD. Patient has been getting yearly physicals. He thinks he had colonoscopy around age 42.  Past Medical History:  Diagnosis Date  . Arthritis   . Cancer [facility])    prostate cancer- 2012   Past Surgical History:  Procedure Laterality Date  . PROSTATE SURGERY     2012   . right knee meniscus surgery      1998  . right thumb surgery    . ruptured blood vessels in scrotum surgery      1977  . 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 He has never used smokeless tobacco. He reports that he drinks alcohol. He reports that he does not use drugs. family history includes Cancer (age of onset: 67) in his brother; Heart disease in his father. No Known Allergies   Review of Systems  Constitutional: Negative for fatigue.  Eyes: Negative for visual disturbance.  Respiratory: Negative for cough, chest tightness and shortness of breath.   Cardiovascular: Negative for chest pain, palpitations and leg swelling.  Neurological: Negative for dizziness, syncope, weakness, light-headedness and headaches.        Objective:   Physical Exam  Constitutional: He is oriented to person, place, and time. He appears well-developed and well-nourished.  HENT:  Right Ear: External ear normal.  Left Ear: External ear normal.  Mouth/Throat: Oropharynx is clear and moist.  Eyes: Pupils are equal, round, and reactive to light.  Neck: Neck supple. No thyromegaly present.  Cardiovascular: Normal rate and regular rhythm.   Pulmonary/Chest: Effort normal and breath sounds normal. No respiratory distress. He has no wheezes. He has no rales.  Musculoskeletal: He exhibits no edema.  Neurological: He is alert and oriented to person, place, and time.       Assessment:     #1 history of hyperlipidemia treated with atorvastatin  #2 history of osteoarthritis with previous left total hip replacement  #3 history of adenocarcinoma prostate status post robotic prostatectomy 2012  #4 borderline elevated blood pressure. Follow-up reading after rest 128/90    Plan:     -schedule complete physical -Patient already had flu vaccine through work -Continue regular aerobic exercise and close monitoring of blood pressure -Refill atorvastatin for 3 months until he can establish for physical -reduce wine to no more than 10 ounces per day.  [name] MD [facility] at [facility]

## 2016-02-18 NOTE — Progress Notes (Signed)
Pre visit review using our clinic review tool, if applicable. No additional management support is needed unless otherwise documented below in the visit note. 

## 2016-02-18 NOTE — Patient Instructions (Signed)
Set up physical exam. 

## 2016-02-29 DIAGNOSIS — C61 Malignant neoplasm of prostate: Secondary | ICD-10-CM | POA: Diagnosis not present

## 2016-03-07 DIAGNOSIS — Z8546 Personal history of malignant neoplasm of prostate: Secondary | ICD-10-CM | POA: Diagnosis not present

## 2016-03-07 DIAGNOSIS — N5201 Erectile dysfunction due to arterial insufficiency: Secondary | ICD-10-CM | POA: Diagnosis not present

## 2016-04-21 ENCOUNTER — Other Ambulatory Visit: Payer: BLUE CROSS/BLUE SHIELD

## 2016-04-25 ENCOUNTER — Encounter: Payer: BLUE CROSS/BLUE SHIELD | Admitting: Family Medicine

## 2016-06-02 ENCOUNTER — Other Ambulatory Visit: Payer: BLUE CROSS/BLUE SHIELD

## 2016-06-06 ENCOUNTER — Encounter: Payer: BLUE CROSS/BLUE SHIELD | Admitting: Family Medicine

## 2016-06-21 ENCOUNTER — Other Ambulatory Visit: Payer: Self-pay | Admitting: Family Medicine

## 2016-06-23 NOTE — Telephone Encounter (Signed)
I don't see a recent Lipid panel.  Okay to fill?

## 2016-06-23 NOTE — Telephone Encounter (Signed)
He is new to our practice.  Plan had been for him to set up CPE and get at that time.  Refill for 6 months and remind to set up CPE this year.

## 2016-06-27 ENCOUNTER — Encounter: Payer: BLUE CROSS/BLUE SHIELD | Admitting: Family Medicine

## 2016-08-01 ENCOUNTER — Encounter: Payer: Self-pay | Admitting: Family Medicine

## 2016-08-01 ENCOUNTER — Ambulatory Visit (INDEPENDENT_AMBULATORY_CARE_PROVIDER_SITE_OTHER): Payer: BLUE CROSS/BLUE SHIELD | Admitting: Family Medicine

## 2016-08-01 VITALS — BP 122/86 | HR 58 | Temp 98.0°F | Ht 70.5 in | Wt 204.0 lb

## 2016-08-01 DIAGNOSIS — Z0000 Encounter for general adult medical examination without abnormal findings: Secondary | ICD-10-CM | POA: Diagnosis not present

## 2016-08-01 LAB — BASIC METABOLIC PANEL
BUN: 12 mg/dL (ref 6–23)
CALCIUM: 10 mg/dL (ref 8.4–10.5)
CO2: 29 mEq/L (ref 19–32)
Chloride: 105 mEq/L (ref 96–112)
Creatinine, Ser: 0.94 mg/dL (ref 0.40–1.50)
GFR: 87.6 mL/min (ref >60.00)
Glucose, Bld: 92 mg/dL (ref 70–99)
POTASSIUM: 4.4 meq/L (ref 3.5–5.1)
SODIUM: 142 meq/L (ref 135–145)

## 2016-08-01 LAB — CBC WITH DIFFERENTIAL/PLATELET
BASOS PCT: 0.4 % (ref 0.0–3.0)
Basophils Absolute: 0 10*3/uL (ref 0.0–0.1)
EOS PCT: 2.2 % (ref 0.0–5.0)
Eosinophils Absolute: 0.1 10*3/uL (ref 0.0–0.7)
HCT: 46.3 % (ref 39.0–52.0)
HEMOGLOBIN: 16 g/dL (ref 13.0–17.0)
Lymphocytes Relative: 12.2 % (ref 12.0–46.0)
Lymphs Abs: 0.7 10*3/uL (ref 0.7–4.0)
MCHC: 34.6 g/dL (ref 30.0–36.0)
MCV: 92.8 fl (ref 78.0–100.0)
MONO ABS: 0.5 10*3/uL (ref 0.1–1.0)
MONOS PCT: 9.3 % (ref 3.0–12.0)
Neutro Abs: 4.2 10*3/uL (ref 1.4–7.7)
Neutrophils Relative %: 75.9 % (ref 43.0–77.0)
Platelets: 254 10*3/uL (ref 150.0–400.0)
RBC: 4.99 Mil/uL (ref 4.22–5.81)
RDW: 13.7 % (ref 11.5–15.5)
WBC: 5.5 10*3/uL (ref 4.0–10.5)

## 2016-08-01 LAB — HEPATIC FUNCTION PANEL
ALK PHOS: 57 U/L (ref 39–117)
ALT: 23 U/L (ref 0–53)
AST: 16 U/L (ref 0–37)
Albumin: 4.7 g/dL (ref 3.5–5.2)
Bilirubin, Direct: 0.2 mg/dL (ref 0.0–0.3)
TOTAL PROTEIN: 6.8 g/dL (ref 6.0–8.3)
Total Bilirubin: 1.1 mg/dL (ref 0.2–1.2)

## 2016-08-01 LAB — LIPID PANEL
CHOLESTEROL: 202 mg/dL — AB (ref 0–200)
HDL: 67.6 mg/dL (ref >39.00)
LDL Cholesterol: 114 mg/dL — ABNORMAL HIGH (ref 0–99)
NonHDL: 133.99
Total CHOL/HDL Ratio: 3
Triglycerides: 98 mg/dL (ref 0.0–149.0)
VLDL: 19.6 mg/dL (ref 0.0–40.0)

## 2016-08-01 LAB — TSH: TSH: 0.95 u[IU]/mL (ref 0.35–4.50)

## 2016-08-01 NOTE — Progress Notes (Signed)
Subjective:     Patient ID: Scott Rodgers, male   DOB: 1960-11-17, 56 y.o.   MRN: [id number]  HPI Patient seen for physical exam. He is followed yearly by urologist with prior history of prostate cancer robotic surgery. He also is followed dermatology has had previous basal cell carcinomas. He has hyperlipidemia treated with Lipitor. No history of CAD. Colonoscopy up-to-date. Will need repeat 2 years. Tetanus up-to-date. No specific risk factors for hepatitis C. He plans to get follow-up PSA testing through urology  Past Medical History:  Diagnosis Date  . Arthritis   . Cancer [facility])    prostate cancer- 2012   Past Surgical History:  Procedure Laterality Date  . PROSTATE SURGERY     2012   . right knee meniscus surgery      1998  . right thumb surgery    . ruptured blood vessels in scrotum surgery      1977  . 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 He has never used smokeless tobacco. He reports that he drinks alcohol. He reports that he does not use drugs. family history includes Cancer (age of onset: 48) in his brother; Heart disease in his father. No Known Allergies   Review of Systems  Constitutional: Negative for activity change, appetite change, fatigue and fever.  HENT: Negative for congestion, ear pain and trouble swallowing.   Eyes: Negative for pain and visual disturbance.  Respiratory: Negative for cough, shortness of breath and wheezing.   Cardiovascular: Negative for chest pain and palpitations.  Gastrointestinal: Negative for abdominal distention, abdominal pain, blood in stool, constipation, diarrhea, nausea, rectal pain and vomiting.  Genitourinary: Negative for dysuria, hematuria and testicular pain.  Musculoskeletal: Negative for arthralgias and joint swelling.  Skin:  Negative for rash.  Neurological: Negative for dizziness, syncope and headaches.  Hematological: Negative for adenopathy.  Psychiatric/Behavioral: Negative for confusion and dysphoric mood.       Objective:   Physical Exam  Constitutional: He is oriented to person, place, and time. He appears well-developed and well-nourished. No distress.  HENT:  Head: Normocephalic and atraumatic.  Right Ear: External ear normal.  Left Ear: External ear normal.  Mouth/Throat: Oropharynx is clear and moist.  Eyes: Conjunctivae and EOM are normal. Pupils are equal, round, and reactive to light.  Neck: Normal range of motion. Neck supple. No thyromegaly present.  Cardiovascular: Normal rate, regular rhythm and normal heart sounds.   No murmur heard. Pulmonary/Chest: No respiratory distress. He has no wheezes. He has no rales.  Abdominal: Soft. Bowel sounds are normal. He exhibits no distension and no mass. There is no tenderness. There is no rebound and no guarding.  Genitourinary:  Genitourinary Comments: Per urology  Musculoskeletal: He exhibits no edema.  Lymphadenopathy:    He has no cervical adenopathy.  Neurological: He is alert and oriented to person, place, and time. He displays normal reflexes. No cranial nerve deficit.  Skin: No rash noted.  Multiple scattered nevi. No atypical features  Psychiatric: He has a normal mood and affect.       Assessment:     Physical exam. Patient is not had prior hepatitis C screening. He has prostate cancer followed by urology.    Plan:     -Obtain screening labs. Will defer on PSA since he gets this through  urology -Check  Hep C Antibody -Continue regular exercise habits -Repeat colonoscopy in 2 years  [name] MD [facility] at [facility]

## 2016-08-02 LAB — HEPATITIS C ANTIBODY: HCV Ab: NEGATIVE

## 2016-12-25 ENCOUNTER — Other Ambulatory Visit: Payer: Self-pay | Admitting: Family Medicine

## 2017-02-11 ENCOUNTER — Telehealth: Payer: Self-pay | Admitting: Family Medicine

## 2017-02-11 NOTE — Telephone Encounter (Signed)
Immunization ready for pick up and wife is aware

## 2017-02-11 NOTE — Telephone Encounter (Signed)
Copied from [place] [id number]. Topic: General - Other >> Feb 09, 2017 12:54 PM Clack, Laban Emperor wrote: Reason for CRM: Pt wife calling to request a copy of the pt immunizations records. Please call when they are ready for pick up or they can be release on MyChart.

## 2017-03-05 DIAGNOSIS — Z8546 Personal history of malignant neoplasm of prostate: Secondary | ICD-10-CM | POA: Diagnosis not present

## 2017-03-05 DIAGNOSIS — N5201 Erectile dysfunction due to arterial insufficiency: Secondary | ICD-10-CM | POA: Diagnosis not present

## 2017-09-23 ENCOUNTER — Other Ambulatory Visit: Payer: Self-pay | Admitting: Family Medicine

## 2017-10-27 ENCOUNTER — Telehealth: Payer: Self-pay | Admitting: Family Medicine

## 2017-10-27 MED ORDER — ATORVASTATIN CALCIUM 10 MG PO TABS: 10.0000 mg | ORAL_TABLET | Freq: Every day | ORAL | 0 refills | Status: DC

## 2017-10-27 NOTE — Telephone Encounter (Signed)
Copied from [place] [id number]. Topic: Quick Communication - Rx Refill/Question >> Oct 27, 2017 12:54 PM Oliver Pila B wrote: Medication: atorvastatin (LIPITOR) 10 MG tablet [[id number]]   Pt called to ask if he could get a partial refill until his appt in Oct; contact pt to advise

## 2017-10-27 NOTE — Telephone Encounter (Signed)
Refill sent.

## 2017-12-04 ENCOUNTER — Ambulatory Visit (INDEPENDENT_AMBULATORY_CARE_PROVIDER_SITE_OTHER): Payer: 59 | Admitting: Family Medicine

## 2017-12-04 ENCOUNTER — Other Ambulatory Visit: Payer: Self-pay

## 2017-12-04 ENCOUNTER — Encounter: Payer: Self-pay | Admitting: Family Medicine

## 2017-12-04 VITALS — BP 118/80 | HR 76 | Temp 98.2°F | Ht 67.5 in | Wt 206.1 lb

## 2017-12-04 DIAGNOSIS — Z0000 Encounter for general adult medical examination without abnormal findings: Secondary | ICD-10-CM | POA: Diagnosis not present

## 2017-12-04 DIAGNOSIS — Z23 Encounter for immunization: Secondary | ICD-10-CM

## 2017-12-04 LAB — HEPATIC FUNCTION PANEL
ALBUMIN: 4.7 g/dL (ref 3.5–5.2)
ALK PHOS: 61 U/L (ref 39–117)
ALT: 29 U/L (ref 0–53)
AST: 20 U/L (ref 0–37)
Bilirubin, Direct: 0.2 mg/dL (ref 0.0–0.3)
TOTAL PROTEIN: 7 g/dL (ref 6.0–8.3)
Total Bilirubin: 1 mg/dL (ref 0.2–1.2)

## 2017-12-04 LAB — BASIC METABOLIC PANEL
BUN: 19 mg/dL (ref 6–23)
CALCIUM: 9.9 mg/dL (ref 8.4–10.5)
CO2: 31 mEq/L (ref 19–32)
Chloride: 103 mEq/L (ref 96–112)
Creatinine, Ser: 0.99 mg/dL (ref 0.40–1.50)
GFR: 82.13 mL/min (ref >60.00)
GLUCOSE: 91 mg/dL (ref 70–99)
Potassium: 4.3 mEq/L (ref 3.5–5.1)
SODIUM: 141 meq/L (ref 135–145)

## 2017-12-04 LAB — CBC WITH DIFFERENTIAL/PLATELET
Basophils Absolute: 0 10*3/uL (ref 0.0–0.1)
Basophils Relative: 0.5 % (ref 0.0–3.0)
EOS PCT: 1.5 % (ref 0.0–5.0)
Eosinophils Absolute: 0.1 10*3/uL (ref 0.0–0.7)
HEMATOCRIT: 47.3 % (ref 39.0–52.0)
HEMOGLOBIN: 16.2 g/dL (ref 13.0–17.0)
LYMPHS ABS: 0.8 10*3/uL (ref 0.7–4.0)
LYMPHS PCT: 16.7 % (ref 12.0–46.0)
MCHC: 34.1 g/dL (ref 30.0–36.0)
MCV: 93.1 fl (ref 78.0–100.0)
Monocytes Absolute: 0.5 10*3/uL (ref 0.1–1.0)
Monocytes Relative: 9.3 % (ref 3.0–12.0)
NEUTROS PCT: 72 % (ref 43.0–77.0)
Neutro Abs: 3.6 10*3/uL (ref 1.4–7.7)
Platelets: 211 10*3/uL (ref 150.0–400.0)
RBC: 5.08 Mil/uL (ref 4.22–5.81)
RDW: 13.4 % (ref 11.5–15.5)
WBC: 5 10*3/uL (ref 4.0–10.5)

## 2017-12-04 LAB — LIPID PANEL
Cholesterol: 181 mg/dL (ref 0–200)
HDL: 55.3 mg/dL (ref >39.00)
LDL Cholesterol: 98 mg/dL (ref 0–99)
NONHDL: 125.73
Total CHOL/HDL Ratio: 3
Triglycerides: 141 mg/dL (ref 0.0–149.0)
VLDL: 28.2 mg/dL (ref 0.0–40.0)

## 2017-12-04 LAB — TSH: TSH: 0.79 u[IU]/mL (ref 0.35–4.50)

## 2017-12-04 NOTE — Progress Notes (Signed)
Subjective:     Patient ID: Scott Rodgers, male   DOB: [birth year], 57 y.o.   MRN: [id number]  HPI Patient seen for physical exam.  He had prostate cancer about 7 years ago and gets yearly PSA through urology.  He is due for repeat colonoscopy next year.  He has hyperlipidemia treated with Lipitor.  No history of CAD.  Never smoked.  Just started a new job during this past year.  Current job requires frequent travel to [place].  He is married with 3 children  He has had multiple basal cell carcinomas in the past and is looking for a new dermatologist  Past Medical History:  Diagnosis Date  . Arthritis   . Cancer [facility])    prostate cancer- 2012   Past Surgical History:  Procedure Laterality Date  . PROSTATE SURGERY     2012   . right knee meniscus surgery      1998, again on 07/06/17  . right thumb surgery    . ruptured blood vessels in scrotum surgery      1977  . 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 He has never used smokeless tobacco. He reports that he drinks alcohol. He reports that he does not use drugs. family history includes Cancer (age of onset: 77) in his brother; Heart disease in his father. No Known Allergies   Review of Systems  Constitutional: Negative for activity change, appetite change, fatigue and fever.  HENT: Negative for congestion, ear pain and trouble swallowing.   Eyes: Negative for pain and visual disturbance.  Respiratory: Negative for cough, shortness of breath and wheezing.   Cardiovascular: Negative for chest pain and palpitations.  Gastrointestinal: Negative for abdominal distention, abdominal pain, blood in stool, constipation, diarrhea, nausea, rectal pain and vomiting.  Genitourinary: Negative for dysuria, hematuria and testicular pain.  Musculoskeletal:  Negative for arthralgias and joint swelling.  Skin: Negative for rash.  Neurological: Negative for dizziness, syncope and headaches.  Hematological: Negative for adenopathy.  Psychiatric/Behavioral: Negative for confusion and dysphoric mood.       Objective:   Physical Exam  Constitutional: He is oriented to person, place, and time. He appears well-developed and well-nourished. No distress.  HENT:  Head: Normocephalic and atraumatic.  Right Ear: External ear normal.  Left Ear: External ear normal.  Mouth/Throat: Oropharynx is clear and moist.  Eyes: Pupils are equal, round, and reactive to light. Conjunctivae and EOM are normal.  Neck: Normal range of motion. Neck supple. No thyromegaly present.  Cardiovascular: Normal rate, regular rhythm and normal heart sounds.  No murmur heard. Pulmonary/Chest: No respiratory distress. He has no wheezes. He has no rales.  Abdominal: Soft. Bowel sounds are normal. He exhibits no distension and no mass. There is no tenderness. There is no rebound and no guarding.  Musculoskeletal: He exhibits no edema.  Lymphadenopathy:    He has no cervical adenopathy.  Neurological: He is alert and oriented to person, place, and time. He displays normal reflexes. No cranial nerve deficit.  Skin: No rash noted.  Psychiatric: He has a normal mood and affect.       Assessment:     Physical exam.  The following health maintenance issues were addressed    Plan:     -Flu vaccine already given about 2 weeks ago -Discussed  shingles vaccine and he would like to go ahead with initiating that today.  Repeat booster in 2-6 months. -Obtain screening labs.  We deferred PSA since he gets this through urology clinic yearly  [name] MD [facility] at [facility]

## 2017-12-04 NOTE — Patient Instructions (Signed)
Remember to get shingles booster in 2 to 6 months.

## 2017-12-06 ENCOUNTER — Encounter: Payer: Self-pay | Admitting: Family Medicine

## 2018-03-08 ENCOUNTER — Ambulatory Visit (INDEPENDENT_AMBULATORY_CARE_PROVIDER_SITE_OTHER): Payer: 59 | Admitting: *Deleted

## 2018-03-08 DIAGNOSIS — Z23 Encounter for immunization: Secondary | ICD-10-CM | POA: Diagnosis not present

## 2018-03-08 NOTE — Progress Notes (Signed)
Per orders of Dr. Elease Hashimoto, injection of Shingrix (#2) given by Virl Cagey. Patient tolerated injection well.

## 2018-04-18 ENCOUNTER — Other Ambulatory Visit: Payer: Self-pay | Admitting: Family Medicine

## 2018-04-23 ENCOUNTER — Telehealth: Payer: Self-pay | Admitting: Family Medicine

## 2018-04-23 NOTE — Telephone Encounter (Signed)
Copied from [place] [phone]. Topic: Quick Communication - See Telephone Encounter >> Apr 23, 2018  3:29 PM Blase Mess A wrote: CRM for notification. See Telephone encounter for: 04/23/18.  Patients wife is calling to see if an order can be placed for pneumovax. Please advise Thank you

## 2018-04-27 NOTE — Telephone Encounter (Signed)
We need to clarify.  Are they asking for pneumovax for her or him?  I think I already gave approval for pneumovax for Anderson Malta, his wife.

## 2018-04-28 NOTE — Telephone Encounter (Signed)
Called patient and LMOVM to return call  [place] for [facility] to Discuss results / PCP / recommendations / Schedule patient  Scott Rodgers, patients wife, per Dr. Elease Hashimoto is the pneumovax order requested for her or for [name]?  CRM Created.

## 2018-10-19 ENCOUNTER — Other Ambulatory Visit: Payer: Self-pay | Admitting: Family Medicine

## 2018-12-09 ENCOUNTER — Telehealth: Payer: Self-pay | Admitting: *Deleted

## 2018-12-09 NOTE — Telephone Encounter (Signed)
Copied from [place] [id number]. Topic: General - Other >> Dec 07, 2018  4:45 PM Sheran Luz wrote: Patient calling to inquire when it is time for his colonoscopy. Please advise.

## 2018-12-10 ENCOUNTER — Encounter: Payer: Self-pay | Admitting: Internal Medicine

## 2018-12-10 ENCOUNTER — Other Ambulatory Visit: Payer: Self-pay | Admitting: Family Medicine

## 2018-12-10 ENCOUNTER — Other Ambulatory Visit: Payer: Self-pay | Admitting: *Deleted

## 2018-12-10 DIAGNOSIS — E785 Hyperlipidemia, unspecified: Secondary | ICD-10-CM

## 2018-12-10 DIAGNOSIS — Z1211 Encounter for screening for malignant neoplasm of colon: Secondary | ICD-10-CM

## 2018-12-10 MED ORDER — ATORVASTATIN CALCIUM 10 MG PO TABS: 10.0000 mg | ORAL_TABLET | Freq: Every day | ORAL | 0 refills | Status: DC

## 2018-12-10 NOTE — Telephone Encounter (Signed)
Spoke with patient. Informed patient he is due for colonoscopy. Referral placed. Patient also asked for refill on Lipitor. Informed him I could only refill for 30 days since he has not been seen in over a year. Patient verbalized understanding. CPE scheduled for 11/17.

## 2018-12-28 ENCOUNTER — Ambulatory Visit (INDEPENDENT_AMBULATORY_CARE_PROVIDER_SITE_OTHER): Payer: 59 | Admitting: Family Medicine

## 2018-12-28 ENCOUNTER — Encounter: Payer: Self-pay | Admitting: Family Medicine

## 2018-12-28 ENCOUNTER — Other Ambulatory Visit: Payer: Self-pay

## 2018-12-28 VITALS — BP 122/78 | HR 65 | Temp 97.2°F | Ht 70.0 in | Wt 213.4 lb

## 2018-12-28 DIAGNOSIS — Z0000 Encounter for general adult medical examination without abnormal findings: Secondary | ICD-10-CM | POA: Diagnosis not present

## 2018-12-28 DIAGNOSIS — L989 Disorder of the skin and subcutaneous tissue, unspecified: Secondary | ICD-10-CM

## 2018-12-28 LAB — LIPID PANEL
Cholesterol: 236 mg/dL — ABNORMAL HIGH (ref 0–200)
HDL: 56.7 mg/dL (ref >39.00)
LDL Cholesterol: 151 mg/dL — ABNORMAL HIGH (ref 0–99)
NonHDL: 179.41
Total CHOL/HDL Ratio: 4
Triglycerides: 142 mg/dL (ref 0.0–149.0)
VLDL: 28.4 mg/dL (ref 0.0–40.0)

## 2018-12-28 LAB — BASIC METABOLIC PANEL
BUN: 20 mg/dL (ref 6–23)
CO2: 30 mEq/L (ref 19–32)
Calcium: 9.8 mg/dL (ref 8.4–10.5)
Chloride: 102 mEq/L (ref 96–112)
Creatinine, Ser: 0.98 mg/dL (ref 0.40–1.50)
GFR: 77.9 mL/min (ref >60.00)
Glucose, Bld: 105 mg/dL — ABNORMAL HIGH (ref 70–99)
Potassium: 4.5 mEq/L (ref 3.5–5.1)
Sodium: 140 mEq/L (ref 135–145)

## 2018-12-28 LAB — CBC WITH DIFFERENTIAL/PLATELET
Basophils Absolute: 0 10*3/uL (ref 0.0–0.1)
Basophils Relative: 0.7 % (ref 0.0–3.0)
Eosinophils Absolute: 0.2 10*3/uL (ref 0.0–0.7)
Eosinophils Relative: 3.2 % (ref 0.0–5.0)
HCT: 45.7 % (ref 39.0–52.0)
Hemoglobin: 15.7 g/dL (ref 13.0–17.0)
Lymphocytes Relative: 17.1 % (ref 12.0–46.0)
Lymphs Abs: 0.8 10*3/uL (ref 0.7–4.0)
MCHC: 34.4 g/dL (ref 30.0–36.0)
MCV: 93.5 fl (ref 78.0–100.0)
Monocytes Absolute: 0.7 10*3/uL (ref 0.1–1.0)
Monocytes Relative: 14.4 % — ABNORMAL HIGH (ref 3.0–12.0)
Neutro Abs: 3.2 10*3/uL (ref 1.4–7.7)
Neutrophils Relative %: 64.6 % (ref 43.0–77.0)
Platelets: 224 10*3/uL (ref 150.0–400.0)
RBC: 4.89 Mil/uL (ref 4.22–5.81)
RDW: 14 % (ref 11.5–15.5)
WBC: 4.9 10*3/uL (ref 4.0–10.5)

## 2018-12-28 LAB — HEPATIC FUNCTION PANEL
ALT: 37 U/L (ref 0–53)
AST: 28 U/L (ref 0–37)
Albumin: 4.7 g/dL (ref 3.5–5.2)
Alkaline Phosphatase: 68 U/L (ref 39–117)
Bilirubin, Direct: 0.2 mg/dL (ref 0.0–0.3)
Total Bilirubin: 1.3 mg/dL — ABNORMAL HIGH (ref 0.2–1.2)
Total Protein: 7 g/dL (ref 6.0–8.3)

## 2018-12-28 LAB — TSH: TSH: 1.13 u[IU]/mL (ref 0.35–4.50)

## 2018-12-28 NOTE — Progress Notes (Signed)
Subjective:     Patient ID: Scott Rodgers, male   DOB: 05-22-1960, 58 y.o.   MRN: [id number]  HPI Scott Rodgers is seen for physical exam.  He had prostate cancer about 8 years ago and still sees urologist yearly.  He gets PSAs through them.  He had previous prostatectomy.  Generally doing well.  He takes Lipitor for hyperlipidemia but no other regular medications.  He is requesting dermatology referral.  He has recurrent skin lesion on the left tip of the nose which is slightly scaly he tends to scrape this off frequently.  He states he has had basal cell carcinomas previously.  He wants to establish with a local dermatologist.  Also requesting names of local optometrist or ophthalmologist for generalized screening.  No specific concerns.  He is scheduled for repeat colonoscopy December 11.  He is already had flu vaccine.  Tetanus up-to-date.  Shingrix already given.  He is currently working from home.  Exercising once or twice daily. Non-smoker  Past Medical History:  Diagnosis Date  . Arthritis   . Cancer [facility])    prostate cancer- 2012   Past Surgical History:  Procedure Laterality Date  . PROSTATE SURGERY     2012   . right knee meniscus surgery      1998, again on 07/06/17  . right thumb surgery    . ruptured blood vessels in scrotum surgery      1977  . 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 He has never used smokeless tobacco. He reports current alcohol use. He reports that he does not use drugs. family history includes Cancer (age of onset: 8) in his brother; Heart disease in his father. No Known Allergies   Review of Systems  Constitutional: Negative for activity change, appetite change, fatigue and fever.  HENT: Negative for congestion, ear pain and trouble swallowing.   Eyes: Negative for  pain and visual disturbance.  Respiratory: Negative for cough, shortness of breath and wheezing.   Cardiovascular: Negative for chest pain and palpitations.  Gastrointestinal: Negative for abdominal distention, abdominal pain, blood in stool, constipation, diarrhea, nausea, rectal pain and vomiting.  Endocrine: Negative for polydipsia and polyuria.  Genitourinary: Negative for dysuria, hematuria and testicular pain.  Musculoskeletal: Negative for arthralgias and joint swelling.  Skin: Negative for rash.  Neurological: Negative for dizziness, syncope and headaches.  Hematological: Negative for adenopathy.  Psychiatric/Behavioral: Negative for confusion and dysphoric mood.       Objective:   Physical Exam Constitutional:      General: He is not in acute distress.    Appearance: He is well-developed.  HENT:     Head: Normocephalic and atraumatic.     Right Ear: External ear normal.     Left Ear: External ear normal.  Eyes:     Conjunctiva/sclera: Conjunctivae normal.     Pupils: Pupils are equal, round, and reactive to light.  Neck:     Musculoskeletal: Normal range of motion and neck supple.     Thyroid: No thyromegaly.  Cardiovascular:     Rate and Rhythm: Normal rate and regular rhythm.     Heart sounds: Normal heart sounds. No murmur.  Pulmonary:     Effort: No respiratory distress.     Breath sounds: No wheezing or rales.  Abdominal:  General: Bowel sounds are normal. There is no distension.     Palpations: Abdomen is soft. There is no mass.     Tenderness: There is no abdominal tenderness. There is no guarding or rebound.  Lymphadenopathy:     Cervical: No cervical adenopathy.  Skin:    Findings: No rash.     Comments: He has slightly abraded area left distal nose but no nodular regrowth.  No abnormal pigmentation.  Neurological:     Mental Status: He is alert and oriented to person, place, and time.     Cranial Nerves: No cranial nerve deficit.     Deep Tendon  Reflexes: Reflexes normal.        Assessment:     Physical exam.  Past history of prostate cancer with previous prostatectomy.  Hyperlipidemia treated with Lipitor.  We discussed the following health maintenance and care issues    Plan:     -Flu vaccine already given -Other immunizations up-to-date -We will put in for dermatology referral -He was given names of local optometrist for generalized screening -Obtain follow-up labs minus PSA which he gets through urology. -Continue regular exercise habits  [name] MD [facility]

## 2018-12-28 NOTE — Patient Instructions (Signed)
Preventive Care 40-60 Years Old, Male Preventive care refers to lifestyle choices and visits with your health care provider that can promote health and wellness. This includes:  A yearly physical exam. This is also called an annual well check.  Regular dental and eye exams.  Immunizations.  Screening for certain conditions.  Healthy lifestyle choices, such as eating a healthy diet, getting regular exercise, not using drugs or products that contain nicotine and tobacco, and limiting alcohol use. What can I expect for my preventive care visit? Physical exam Your health care provider will check:  Height and weight. These may be used to calculate body mass index (BMI), which is a measurement that tells if you are at a healthy weight.  Heart rate and blood pressure.  Your skin for abnormal spots. Counseling Your health care provider may ask you questions about:  Alcohol, tobacco, and drug use.  Emotional well-being.  Home and relationship well-being.  Sexual activity.  Eating habits.  Work and work environment. What immunizations do I need?  Influenza (flu) vaccine  This is recommended every year. Tetanus, diphtheria, and pertussis (Tdap) vaccine  You may need a Td booster every 10 years. Varicella (chickenpox) vaccine  You may need this vaccine if you have not already been vaccinated. Zoster (shingles) vaccine  You may need this after age 60. Measles, mumps, and rubella (MMR) vaccine  You may need at least one dose of MMR if you were born in 1957 or later. You may also need a second dose. Pneumococcal conjugate (PCV13) vaccine  You may need this if you have certain conditions and were not previously vaccinated. Pneumococcal polysaccharide (PPSV23) vaccine  You may need one or two doses if you smoke cigarettes or if you have certain conditions. Meningococcal conjugate (MenACWY) vaccine  You may need this if you have certain conditions. Hepatitis A vaccine   You may need this if you have certain conditions or if you travel or work in places where you may be exposed to hepatitis A. Hepatitis B vaccine  You may need this if you have certain conditions or if you travel or work in places where you may be exposed to hepatitis B. Haemophilus influenzae type b (Hib) vaccine  You may need this if you have certain risk factors. Human papillomavirus (HPV) vaccine  If recommended by your health care provider, you may need three doses over 6 months. You may receive vaccines as individual doses or as more than one vaccine together in one shot (combination vaccines). Talk with your health care provider about the risks and benefits of combination vaccines. What tests do I need? Blood tests  Lipid and cholesterol levels. These may be checked every 5 years, or more frequently if you are over 50 years old.  Hepatitis C test.  Hepatitis B test. Screening  Lung cancer screening. You may have this screening every year starting at age 55 if you have a 30-pack-year history of smoking and currently smoke or have quit within the past 15 years.  Prostate cancer screening. Recommendations will vary depending on your family history and other risks.  Colorectal cancer screening. All adults should have this screening starting at age 50 and continuing until age 75. Your health care provider may recommend screening at age 45 if you are at increased risk. You will have tests every 1-10 years, depending on your results and the type of screening test.  Diabetes screening. This is done by checking your blood sugar (glucose) after you have not eaten   for a while (fasting). You may have this done every 1-3 years.  Sexually transmitted disease (STD) testing. Follow these instructions at home: Eating and drinking  Eat a diet that includes fresh fruits and vegetables, whole grains, lean protein, and low-fat dairy products.  Take vitamin and mineral supplements as recommended  by your health care provider.  Do not drink alcohol if your health care provider tells you not to drink.  If you drink alcohol: ? Limit how much you have to 0-2 drinks a day. ? Be aware of how much alcohol is in your drink. In the [place] one drink equals one 12 oz bottle of beer (355 mL), one 5 oz glass of wine (148 mL), or one 1 oz glass of hard liquor (44 mL). Lifestyle  Take daily care of your teeth and gums.  Stay active. Exercise for at least 30 minutes on 5 or more days each week.  Do not use any products that contain nicotine or tobacco, such as cigarettes, e-cigarettes, and chewing tobacco. If you need help quitting, ask your health care provider.  If you are sexually active, practice safe sex. Use a condom or other form of protection to prevent STIs (sexually transmitted infections).  Talk with your health care provider about taking a low-dose aspirin every day starting at age 22. What's next?  Go to your health care provider once a year for a well check visit.  Ask your health care provider how often you should have your eyes and teeth checked.  Stay up to date on all vaccines. This information is not intended to replace advice given to you by your health care provider. Make sure you discuss any questions you have with your health care provider. Document Released: 02/23/2015 Document Revised: 01/21/2018 Document Reviewed: 01/21/2018 Elsevier Patient Education  2020 [employer].  Dr Macarthur Critchley- optometrist.

## 2019-01-03 ENCOUNTER — Other Ambulatory Visit: Payer: Self-pay

## 2019-01-03 MED ORDER — ATORVASTATIN CALCIUM 20 MG PO TABS: 20.0000 mg | ORAL_TABLET | Freq: Every day | ORAL | 3 refills | Status: DC

## 2019-01-10 ENCOUNTER — Encounter: Payer: Self-pay | Admitting: Internal Medicine

## 2019-01-10 ENCOUNTER — Ambulatory Visit (AMBULATORY_SURGERY_CENTER): Payer: Self-pay

## 2019-01-10 ENCOUNTER — Other Ambulatory Visit: Payer: Self-pay

## 2019-01-10 VITALS — Temp 96.6°F | Ht 67.5 in | Wt 216.0 lb

## 2019-01-10 DIAGNOSIS — Z1211 Encounter for screening for malignant neoplasm of colon: Secondary | ICD-10-CM

## 2019-01-10 MED ORDER — NA SULFATE-K SULFATE-MG SULF 17.5-3.13-1.6 GM/177ML PO SOLN: 1.0000 | Freq: Once | ORAL | 0 refills | Status: AC

## 2019-01-10 NOTE — Progress Notes (Signed)
Denies allergies to eggs or soy products. Denies complication of anesthesia or sedation. Denies use of weight loss medication. Denies use of O2.   Emmi instructions given for colonoscopy.   Covid screening is scheduled for Tuesday 01/18/19 @ 12:50 Pm. A 15.00 coupon for Suprep was given to the patient.

## 2019-01-18 ENCOUNTER — Ambulatory Visit (INDEPENDENT_AMBULATORY_CARE_PROVIDER_SITE_OTHER): Payer: 59

## 2019-01-18 ENCOUNTER — Other Ambulatory Visit: Payer: Self-pay | Admitting: Internal Medicine

## 2019-01-18 DIAGNOSIS — Z1159 Encounter for screening for other viral diseases: Secondary | ICD-10-CM

## 2019-01-19 LAB — SARS CORONAVIRUS 2 (TAT 6-24 HRS): SARS Coronavirus 2: NEGATIVE

## 2019-01-21 ENCOUNTER — Ambulatory Visit (AMBULATORY_SURGERY_CENTER): Payer: 59 | Admitting: Internal Medicine

## 2019-01-21 ENCOUNTER — Encounter: Payer: Self-pay | Admitting: Internal Medicine

## 2019-01-21 ENCOUNTER — Other Ambulatory Visit: Payer: Self-pay

## 2019-01-21 VITALS — BP 108/76 | HR 59 | Temp 97.7°F | Resp 12 | Ht 67.5 in | Wt 216.0 lb

## 2019-01-21 DIAGNOSIS — Z1211 Encounter for screening for malignant neoplasm of colon: Secondary | ICD-10-CM

## 2019-01-21 DIAGNOSIS — D122 Benign neoplasm of ascending colon: Secondary | ICD-10-CM | POA: Diagnosis not present

## 2019-01-21 DIAGNOSIS — D124 Benign neoplasm of descending colon: Secondary | ICD-10-CM

## 2019-01-21 DIAGNOSIS — D120 Benign neoplasm of cecum: Secondary | ICD-10-CM

## 2019-01-21 MED ORDER — SODIUM CHLORIDE 0.9 % IV SOLN: 500.0000 mL | Freq: Once | INTRAVENOUS | Status: DC

## 2019-01-21 NOTE — Patient Instructions (Signed)
Impression/Recommendations:  Polyp handout given to patient. Hemorrhoid handout given to patient.  Repeat colonoscopy in 5 years for surveillance.  Resume previous diet. Continue present medications. Await pathology results.  YOU HAD AN ENDOSCOPIC PROCEDURE TODAY AT [facility]:   Refer to the procedure report that was given to you for any specific questions about what was found during the examination.  If the procedure report does not answer your questions, please call your gastroenterologist to clarify.  If you requested that your care partner not be given the details of your procedure findings, then the procedure report has been included in a sealed envelope for you to review at your convenience later.  YOU SHOULD EXPECT: Some feelings of bloating in the abdomen. Passage of more gas than usual.  Walking can help get rid of the air that was put into your GI tract during the procedure and reduce the bloating. If you had a lower endoscopy (such as a colonoscopy or flexible sigmoidoscopy) you may notice spotting of blood in your stool or on the toilet paper. If you underwent a bowel prep for your procedure, you may not have a normal bowel movement for a few days.  Please Note:  You might notice some irritation and congestion in your nose or some drainage.  This is from the oxygen used during your procedure.  There is no need for concern and it should clear up in a day or so.  SYMPTOMS TO REPORT IMMEDIATELY:   Following lower endoscopy (colonoscopy or flexible sigmoidoscopy):  Excessive amounts of blood in the stool  Significant tenderness or worsening of abdominal pains  Swelling of the abdomen that is new, acute  Fever of 100F or higher For urgent or emergent issues, a gastroenterologist can be reached at any hour by calling [phone].   DIET:  We do recommend a small meal at first, but then you may proceed to your regular diet.  Drink plenty of fluids but you  should avoid alcoholic beverages for 24 hours.  ACTIVITY:  You should plan to take it easy for the rest of today and you should NOT DRIVE or use heavy machinery until tomorrow (because of the sedation medicines used during the test).    FOLLOW UP: Our staff will call the number listed on your records 48-72 hours following your procedure to check on you and address any questions or concerns that you may have regarding the information given to you following your procedure. If we do not reach you, we will leave a message.  We will attempt to reach you two times.  During this call, we will ask if you have developed any symptoms of COVID 19. If you develop any symptoms (ie: fever, flu-like symptoms, shortness of breath, cough etc.) before then, please call [phone].  If you test positive for Covid 19 in the 2 weeks post procedure, please call and report this information to [place].    If any biopsies were taken you will be contacted by phone or by letter within the next 1-3 weeks.  Please call us at [phone] if you have not heard about the biopsies in 3 weeks.    SIGNATURES/CONFIDENTIALITY: You and/or your care partner have signed paperwork which will be entered into your electronic medical record.  These signatures attest to the fact that that the information above on your After Visit Summary has been reviewed and is understood.  Full responsibility of the confidentiality of this discharge information lies with you and/or  your care-partner.

## 2019-01-21 NOTE — Op Note (Signed)
[facility] Patient Name: Scott Rodgers Procedure Date: 01/21/2019 8:04 AM MRN: [id number] Endoscopist: Docia Chuck. Henrene Pastor , MD Age: 58 Referring MD:  Date of Birth: 11-Feb-1960 Gender: Male Account #: [id number] Procedure:                Colonoscopy with cold snare polypectomy x 4 Indications:              Screening for colorectal malignant neoplasm.                            Reports negative exam elsewhere 10 years ago Medicines:                Monitored Anesthesia Care Procedure:                Pre-Anesthesia Assessment:                           - Prior to the procedure, a History and Physical                            was performed, and patient medications and                            allergies were reviewed. The patient's tolerance of                            previous anesthesia was also reviewed. The risks                            and benefits of the procedure and the sedation                            options and risks were discussed with the patient.                            All questions were answered, and informed consent                            was obtained. Prior Anticoagulants: The patient has                            taken no previous anticoagulant or antiplatelet                            agents. ASA Grade Assessment: II - A patient with                            mild systemic disease. After reviewing the risks                            and benefits, the patient was deemed in                            satisfactory condition to undergo the procedure.  After obtaining informed consent, the colonoscope                            was passed under direct vision. Throughout the                            procedure, the patient's blood pressure, pulse, and                            oxygen saturations were monitored continuously. The                            Colonoscope was introduced through the anus and          advanced to the the cecum, identified by                            appendiceal orifice and ileocecal valve. The                            ileocecal valve, appendiceal orifice, and rectum                            were photographed. The quality of the bowel                            preparation was excellent. The colonoscopy was                            performed without difficulty. The patient tolerated                            the procedure well. The bowel preparation used was                            SUPREP via split dose instruction. Scope In: 8:33:46 AM Scope Out: 8:55:26 AM Scope Withdrawal Time: 0 hours 17 minutes 58 seconds  Total Procedure Duration: 0 hours 21 minutes 40 seconds  Findings:                 Four polyps were found in the descending colon,                            ascending colon and cecum. The polyps were 2 to 4                            mm in size. These polyps were removed with a cold                            snare. Resection and retrieval were complete.                           The exam was otherwise without abnormality on  direct and retroflexion views. Internal hemorrhoids                            present Complications:            No immediate complications. Estimated blood loss:                            None. Estimated Blood Loss:     Estimated blood loss: none. Impression:               - Four 2 to 4 mm polyps in the descending colon, in                            the ascending colon and in the cecum, removed with                            a cold snare. Resected and retrieved.                           - The examination was otherwise normal on direct                            and retroflexion views. Internal hemorrhoids present Recommendation:           - Repeat colonoscopy in 5 years for surveillance.                           - Patient has a contact number available for                             emergencies. The signs and symptoms of potential                            delayed complications were discussed with the                            patient. Return to normal activities tomorrow.                            Written discharge instructions were provided to the                            patient.                           - Resume previous diet.                           - Continue present medications.                           - Await pathology results. Docia Chuck. Henrene Pastor, MD 01/21/2019 9:10:02 AM This report has been signed electronically.

## 2019-01-21 NOTE — Progress Notes (Signed)
Vitals-Corrina Temp-JB  Pt's states no medical or surgical changes since previsit or office visit.

## 2019-01-21 NOTE — Progress Notes (Signed)
A and O x3. Report to RN. Tolerated MAC anesthesia well.

## 2019-01-25 ENCOUNTER — Telehealth: Payer: Self-pay

## 2019-01-25 NOTE — Telephone Encounter (Signed)
  Follow up Call-  Call back number 01/21/2019  Post procedure Call Back phone  # [phone]  Permission to leave phone message Yes  Some recent data might be hidden     Patient questions:  Do you have a fever, pain , or abdominal swelling? No. Pain Score  0 *  Have you tolerated food without any problems? Yes.    Have you been able to return to your normal activities? Yes.    Do you have any questions about your discharge instructions: Diet   No. Medications  No. Follow up visit  No.  Do you have questions or concerns about your Care? No.  Actions: * If pain score is 4 or above: No action needed, pain <4.   1. Have you developed a fever since your procedure? no  2.   Have you had an respiratory symptoms (SOB or cough) since your procedure? no  3.   Have you tested positive for COVID 19 since your procedure no  4.   Have you had any family members/close contacts diagnosed with the COVID 19 since your procedure?  no   If yes to any of these questions please route to Joylene John, RN and Alphonsa Gin, Therapist, sports.

## 2019-01-26 ENCOUNTER — Encounter: Payer: Self-pay | Admitting: Internal Medicine

## 2019-03-17 ENCOUNTER — Encounter: Payer: Self-pay | Admitting: Family Medicine

## 2019-03-18 ENCOUNTER — Other Ambulatory Visit: Payer: Self-pay

## 2019-03-18 ENCOUNTER — Telehealth (INDEPENDENT_AMBULATORY_CARE_PROVIDER_SITE_OTHER): Payer: 59 | Admitting: Family Medicine

## 2019-03-18 DIAGNOSIS — U071 COVID-19: Secondary | ICD-10-CM | POA: Diagnosis not present

## 2019-03-18 NOTE — Telephone Encounter (Signed)
Left message on machine for patient to return our call 

## 2019-03-18 NOTE — Progress Notes (Signed)
This visit type was conducted due to national recommendations for restrictions regarding the COVID-19 pandemic in an effort to limit this patient's exposure and mitigate transmission in our community.   Virtual Visit via Video Note  I connected with [name] on 03/18/19 at  3:45 PM EST by a video enabled telemedicine application and verified that I am speaking with the correct person using two identifiers.  Location patient: home Location provider:work or home office Persons participating in the virtual visit: patient, provider  I discussed the limitations of evaluation and management by telemedicine and the availability of in person appointments. The patient expressed understanding and agreed to proceed.   HPI:  Scott Rodgers is seen today with recent positive Covid test.  He states he was around his daughter and son-in-law last weekend and son came down with symptoms on Monday and then he came down with symptoms on Tuesday.  His son-in-law was tested Monday and came back positive.  Scott Rodgers denies any fever.  He developed some low-grade headache and body aches and fatigue along with very mild cough.  No loss of taste or smell.  No diarrhea symptoms.  No dyspnea at rest.  He has had some lightheadedness with standing but no syncope.  He does not take any hypertension medications.  He feels he is staying well-hydrated.  Appetite maintained fairly well and good food intake.  He does not have any significant major risk factors other than age.  His wife is asymptomatic.  They have been trying to stay isolated as much as possible in the house   ROS: See pertinent positives and negatives per HPI.  Past Medical History:  Diagnosis Date  . Arthritis   . Cancer [facility])    prostate cancer- 2012  . Hyperlipidemia     Past Surgical History:  Procedure Laterality Date  . PROSTATE SURGERY     2012   . right knee meniscus surgery      1998, again on 07/06/17  . right thumb surgery    . ruptured blood  vessels in scrotum surgery      1977  . TOTAL HIP ARTHROPLASTY Left 05/05/2012   Procedure: TOTAL HIP ARTHROPLASTY ANTERIOR APPROACH;  Surgeon: Gearlean Alf, MD;  Location: [facility];  Service: Orthopedics;  Laterality: Left;    Family History  Problem Relation Age of Onset  . Heart disease Father   . Cancer Brother 28       Brain  . Colon cancer Neg Hx   . Esophageal cancer Neg Hx   . Rectal cancer Neg Hx   . Stomach cancer Neg Hx     SOCIAL HX: Non-smoker   Current Outpatient Medications:  .  aspirin 325 MG tablet, Take 325 mg by mouth daily., Disp: , Rfl:  .  atorvastatin (LIPITOR) 20 MG tablet, Take 1 tablet (20 mg total) by mouth daily., Disp: 90 tablet, Rfl: 3 .  cholecalciferol (VITAMIN D) 400 units TABS tablet, Take 400 Units by mouth., Disp: , Rfl:  .  GLUCOSAMINE-CHONDROITIN PO, Take by mouth. 1500-1000mg  daily, Disp: , Rfl:  .  vitamin B-12 (CYANOCOBALAMIN) 100 MCG tablet, Take 100 mcg by mouth daily., Disp: , Rfl:   EXAM:  VITALS per patient if applicable:  GENERAL: alert, oriented, appears well and in no acute distress  HEENT: atraumatic, conjunttiva clear, no obvious abnormalities on inspection of external nose and ears  NECK: normal movements of the head and neck  LUNGS: on inspection no signs of respiratory distress, breathing rate  appears normal, no obvious gross SOB, gasping or wheezing  CV: no obvious cyanosis  MS: moves all visible extremities without noticeable abnormality  PSYCH/NEURO: pleasant and cooperative, no obvious depression or anxiety, speech and thought processing grossly intact  ASSESSMENT AND PLAN:  Discussed the following assessment and plan:  Positive Covid diagnosis which was diagnosed just yesterday.  This is day 3 of symptoms.  Symptoms thus far relatively mild with no dyspnea and no acute distress  -Supportive therapy with plenty fluids and rest. -Continue Tylenol as needed for body aches or headache -Patient is aware of  minimum 10-day quarantine from onset of symptoms -Follow-up for any with progressive dyspnea or other concerns     I discussed the assessment and treatment plan with the patient. The patient was provided an opportunity to ask questions and all were answered. The patient agreed with the plan and demonstrated an understanding of the instructions.   The patient was advised to call back or seek an in-person evaluation if the symptoms worsen or if the condition fails to improve as anticipated.    Carolann Littler, MD

## 2019-05-12 ENCOUNTER — Encounter: Payer: Self-pay | Admitting: Family Medicine

## 2019-05-13 ENCOUNTER — Encounter: Payer: Self-pay | Admitting: Family Medicine

## 2019-05-20 ENCOUNTER — Ambulatory Visit (INDEPENDENT_AMBULATORY_CARE_PROVIDER_SITE_OTHER): Payer: 59 | Admitting: Family Medicine

## 2019-05-20 ENCOUNTER — Encounter: Payer: Self-pay | Admitting: Family Medicine

## 2019-05-20 ENCOUNTER — Other Ambulatory Visit: Payer: Self-pay

## 2019-05-20 VITALS — BP 126/78 | HR 61 | Temp 97.6°F | Wt 217.8 lb

## 2019-05-20 DIAGNOSIS — J3489 Other specified disorders of nose and nasal sinuses: Secondary | ICD-10-CM

## 2019-05-20 MED ORDER — MUPIROCIN 2 % EX OINT: 1.0000 "application " | TOPICAL_OINTMENT | Freq: Two times a day (BID) | CUTANEOUS | 0 refills | Status: DC

## 2019-05-20 NOTE — Progress Notes (Signed)
  Subjective:     Patient ID: Scott Rodgers, male   DOB: 1960-05-08, 59 y.o.   MRN: [id number]  HPI Mikki Santee is seen with some right nasal irritation.  He states about 2 weeks he has had some mild irritation along the medial mucosal of the right anterior nasal septum.  No bleeding.  He has had previous bleeding.  He had some Bactroban cream which she is tried with minimal improvement.  He feels he is staying well-hydrated.  He had recent Covid infection over the winter and completed his second Covid vaccine March 18.  He tolerated well without side effects  Past Medical History:  Diagnosis Date  . Arthritis   . Cancer [facility])    prostate cancer- 2012  . Hyperlipidemia    Past Surgical History:  Procedure Laterality Date  . PROSTATE SURGERY     2012   . right knee meniscus surgery      1998, again on 07/06/17  . right thumb surgery    . ruptured blood vessels in scrotum surgery      1977  . 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 He has never used smokeless tobacco. He reports current alcohol use. He reports that he does not use drugs. family history includes Cancer (age of onset: 72) in his brother; Heart disease in his father. No Known Allergies   Review of Systems  Constitutional: Negative for chills and fever.  Neurological: Negative for headaches.       Objective:   Physical Exam Vitals reviewed.  Constitutional:      Appearance: Normal appearance.  HENT:     Nose:     Comments: Left naris appears normal.  Right naris reveals some drying and nonspecific crusting on the medial anterior septum.  No bleeding.  No worrisome lesions noted. Cardiovascular:     Rate and Rhythm: Normal rate and regular rhythm.  Neurological:     Mental Status: He is alert.        Assessment:     Drying  and local irritation right anterior medial septum    Plan:     -Stay well-hydrated -Consider over-the-counter sterile saline nasal mist couple times daily -Recommend trial of Bactroban ointment to apply with Q-tip couple times daily -Touch base if not improved in 2 weeks  [name] MD [facility]

## 2019-05-20 NOTE — Patient Instructions (Signed)
Stay well hydrated  Use the Bactroban ointment twice daily after using sterile saline moisturizer.

## 2020-01-25 ENCOUNTER — Other Ambulatory Visit: Payer: Self-pay | Admitting: Family Medicine

## 2020-05-29 ENCOUNTER — Encounter: Payer: 59 | Admitting: Family Medicine

## 2020-06-05 ENCOUNTER — Encounter: Payer: 59 | Admitting: Family Medicine

## 2020-06-19 ENCOUNTER — Encounter: Payer: 59 | Admitting: Family Medicine

## 2020-06-26 ENCOUNTER — Ambulatory Visit (INDEPENDENT_AMBULATORY_CARE_PROVIDER_SITE_OTHER): Payer: Managed Care, Other (non HMO) | Admitting: Family Medicine

## 2020-06-26 ENCOUNTER — Other Ambulatory Visit: Payer: Self-pay

## 2020-06-26 ENCOUNTER — Encounter: Payer: Self-pay | Admitting: Family Medicine

## 2020-06-26 VITALS — BP 124/80 | HR 58 | Temp 98.1°F | Ht 67.5 in | Wt 214.2 lb

## 2020-06-26 DIAGNOSIS — Z0000 Encounter for general adult medical examination without abnormal findings: Secondary | ICD-10-CM

## 2020-06-26 DIAGNOSIS — D229 Melanocytic nevi, unspecified: Secondary | ICD-10-CM | POA: Diagnosis not present

## 2020-06-26 LAB — PSA: PSA: 0 ng/mL — ABNORMAL LOW (ref 0.10–4.00)

## 2020-06-26 LAB — CBC WITH DIFFERENTIAL/PLATELET
Basophils Absolute: 0 10*3/uL (ref 0.0–0.1)
Basophils Relative: 0.7 % (ref 0.0–3.0)
Eosinophils Absolute: 0.1 10*3/uL (ref 0.0–0.7)
Eosinophils Relative: 1.9 % (ref 0.0–5.0)
HCT: 46.5 % (ref 39.0–52.0)
Hemoglobin: 15.9 g/dL (ref 13.0–17.0)
Lymphocytes Relative: 15.2 % (ref 12.0–46.0)
Lymphs Abs: 0.7 10*3/uL (ref 0.7–4.0)
MCHC: 34.3 g/dL (ref 30.0–36.0)
MCV: 92.1 fl (ref 78.0–100.0)
Monocytes Absolute: 0.6 10*3/uL (ref 0.1–1.0)
Monocytes Relative: 13.3 % — ABNORMAL HIGH (ref 3.0–12.0)
Neutro Abs: 3.3 10*3/uL (ref 1.4–7.7)
Neutrophils Relative %: 68.9 % (ref 43.0–77.0)
Platelets: 220 10*3/uL (ref 150.0–400.0)
RBC: 5.04 Mil/uL (ref 4.22–5.81)
RDW: 14.1 % (ref 11.5–15.5)
WBC: 4.8 10*3/uL (ref 4.0–10.5)

## 2020-06-26 LAB — LIPID PANEL
Cholesterol: 190 mg/dL (ref 0–200)
HDL: 55.2 mg/dL (ref >39.00)
LDL Cholesterol: 112 mg/dL — ABNORMAL HIGH (ref 0–99)
NonHDL: 135.02
Total CHOL/HDL Ratio: 3
Triglycerides: 114 mg/dL (ref 0.0–149.0)
VLDL: 22.8 mg/dL (ref 0.0–40.0)

## 2020-06-26 LAB — BASIC METABOLIC PANEL
BUN: 18 mg/dL (ref 6–23)
CO2: 30 mEq/L (ref 19–32)
Calcium: 9.8 mg/dL (ref 8.4–10.5)
Chloride: 103 mEq/L (ref 96–112)
Creatinine, Ser: 0.91 mg/dL (ref 0.40–1.50)
GFR: 90.69 mL/min (ref >60.00)
Glucose, Bld: 101 mg/dL — ABNORMAL HIGH (ref 70–99)
Potassium: 4.6 mEq/L (ref 3.5–5.1)
Sodium: 140 mEq/L (ref 135–145)

## 2020-06-26 LAB — TSH: TSH: 0.77 u[IU]/mL (ref 0.35–4.50)

## 2020-06-26 LAB — HEPATIC FUNCTION PANEL
ALT: 25 U/L (ref 0–53)
AST: 22 U/L (ref 0–37)
Albumin: 4.7 g/dL (ref 3.5–5.2)
Alkaline Phosphatase: 64 U/L (ref 39–117)
Bilirubin, Direct: 0.2 mg/dL (ref 0.0–0.3)
Total Bilirubin: 1.3 mg/dL — ABNORMAL HIGH (ref 0.2–1.2)
Total Protein: 6.9 g/dL (ref 6.0–8.3)

## 2020-06-26 NOTE — Progress Notes (Signed)
Established Patient Office Visit  Subjective:  Patient ID: Scott Rodgers, male    DOB: 07-29-1960  Age: 60 y.o. MRN: [id number]  CC:  Chief Complaint  Patient presents with  . Annual Exam    No new concerns    HPI Scott Rodgers presents for physical exam.  He has history of prostate cancer which was diagnosed around 2012 and has had consistently good PSA since then.  Was turned recently loose by urology.  He also has history of osteoarthritis and hyperlipidemia.  He had right Achilles rupture last year and this was repaired and he has recovered fairly well.  Maintenance reviewed:  -Prior hepatitis C antibody screening negative -Tetanus due 2026 -Colonoscopy due 12-25 -Has received COVID vaccines with 1 booster -Has received prior shingles vaccine  Social history-he is married.  He has 3 children.  He has 1 grandson who is about 64 months old and lives in [place].  Non-smoker.  Drinks 1 to 2 glasses of wine per day on the weekends but not daily.  He works for a company that [employer].  He currently exercises several days per week.  Family history-Father reportedly had aortic valve replacement.  No known history of CAD.  His father also had some type of dementia presumably Alzheimer's type.  His mother is 47 and alive and well.  He had a brother who died age 70 of some type of brain tumor.  Past Medical History:  Diagnosis Date  . Arthritis   . Cancer [facility])    prostate cancer- 2012  . Hyperlipidemia     Past Surgical History:  Procedure Laterality Date  . PROSTATE SURGERY     2012   . right knee meniscus surgery      1998, again on 07/06/17  . right thumb surgery    . ruptured blood vessels in scrotum surgery      1977  . 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 Heart disease Father        aortic valve replacement  . Dementia Father   . Cancer Brother 28       Brain  . Colon cancer Neg Hx   . Esophageal cancer Neg Hx   . Rectal cancer Neg Hx   . Stomach cancer Neg Hx     Social History   Socioeconomic History  . Marital status: Married    Spouse name: Not on file  . Number of children: Not on file  . Years of education: Not on file  . Highest education level: Not on file  Occupational History  . Not on file  Tobacco Use  . Smoking status: Never Smoker  . Smokeless tobacco: Never Used  Vaping Use  . Vaping Use: Never used  Substance and Sexual Activity  . Alcohol use: Yes    Comment: 15 drinks per week either wine or beer   . Drug use: No  . Sexual activity: Not on file  Other Topics Concern  . Not on file  Social History Narrative  . Not on file   Social Determinants of Health   Financial Resource Strain: Not on file  Food Insecurity: Not on file  Transportation Needs: Not on file  Physical Activity: Not on file  Stress: Not on file  Social Connections: Not on file  Intimate Partner Violence: Not on file    Outpatient Medications Prior to Visit  Medication Sig Dispense Refill  . aspirin 325 MG tablet Take 325 mg by mouth daily.    Marland Kitchen atorvastatin (LIPITOR) 20 MG tablet TAKE 1 TABLET BY MOUTH EVERY DAY 90 tablet 3  . cholecalciferol (VITAMIN D) 400 units TABS tablet Take 400 Units by mouth.    Marland Kitchen GLUCOSAMINE-CHONDROITIN PO Take by mouth. 1500-1000mg  daily    . mupirocin ointment (BACTROBAN) 2 % Place 1 application into the nose 2 (two) times daily. 22 g 0  . vitamin B-12 (CYANOCOBALAMIN) 100 MCG tablet Take 100 mcg by mouth daily.     No facility-administered medications prior to visit.    No Known Allergies  ROS Review of Systems  Constitutional: Negative for activity change, appetite change, fatigue and fever.  HENT: Negative for congestion, ear pain and trouble swallowing.   Eyes: Negative for  pain and visual disturbance.  Respiratory: Negative for cough, shortness of breath and wheezing.   Cardiovascular: Negative for chest pain and palpitations.  Gastrointestinal: Negative for abdominal distention, abdominal pain, blood in stool, constipation, diarrhea, nausea, rectal pain and vomiting.  Genitourinary: Negative for dysuria, hematuria and testicular pain.  Musculoskeletal: Negative for arthralgias and joint swelling.  Skin: Negative for rash.  Neurological: Negative for dizziness, syncope and headaches.  Hematological: Negative for adenopathy.  Psychiatric/Behavioral: Negative for confusion and dysphoric mood.      Objective:    Physical Exam Constitutional:      General: He is not in acute distress.    Appearance: He is well-developed.  HENT:     Head: Normocephalic and atraumatic.     Right Ear: External ear normal.     Left Ear: External ear normal.  Eyes:     Conjunctiva/sclera: Conjunctivae normal.     Pupils: Pupils are equal, round, and reactive to light.  Neck:     Thyroid: No thyromegaly.  Cardiovascular:     Rate and Rhythm: Normal rate and regular rhythm.     Heart sounds: Normal heart sounds. No murmur heard.   Pulmonary:     Effort: No respiratory distress.     Breath sounds: No wheezing or rales.  Abdominal:     General: Bowel sounds are normal. There is no distension.     Palpations: Abdomen is soft. There is no mass.     Tenderness: There is no abdominal tenderness. There is no guarding or rebound.  Musculoskeletal:     Cervical back: Normal range of motion and neck supple.  Lymphadenopathy:     Cervical: No cervical adenopathy.  Skin:    Findings: No rash.     Comments: Right upper anterior chest reveals somewhat atypical nevus.  Some color variegation, with slightly irregular border and mild asymmetry.  Approximately 5 mm diameter.  Has several nevi scattered on the trunk.  Neurological:     Mental Status: He is alert and oriented to  person, place, and time.     Cranial Nerves: No cranial nerve deficit.     Deep Tendon Reflexes: Reflexes normal.     BP 124/80 (BP Location: Left Arm, Patient Position: Sitting, Cuff Size: Normal)   Pulse (!) 58   Temp 98.1 F (36.7 C) (Oral)   Ht 5' 7.5" (1.715 m)   Wt 214 lb 3.2 oz (97.2 kg)   SpO2 97%   BMI 33.05 kg/m  Wt Readings from Last 3 Encounters:  06/26/20  214 lb 3.2 oz (97.2 kg)  05/20/19 217 lb 12.8 oz (98.8 kg)  01/21/19 216 lb (98 kg)     Health Maintenance Due  Topic Date Due  . HIV Screening  Never done  . COVID-19 Vaccine (3 - Moderna risk 4-dose series) 06/23/2019    There are no preventive care reminders to display for this patient.  Lab Results  Component Value Date   TSH 1.13 12/28/2018   Lab Results  Component Value Date   WBC 4.9 12/28/2018   HGB 15.7 12/28/2018   HCT 45.7 12/28/2018   MCV 93.5 12/28/2018   PLT 224.0 12/28/2018   Lab Results  Component Value Date   NA 140 12/28/2018   K 4.5 12/28/2018   CO2 30 12/28/2018   GLUCOSE 105 (H) 12/28/2018   BUN 20 12/28/2018   CREATININE 0.98 12/28/2018   BILITOT 1.3 (H) 12/28/2018   ALKPHOS 68 12/28/2018   AST 28 12/28/2018   ALT 37 12/28/2018   PROT 7.0 12/28/2018   ALBUMIN 4.7 12/28/2018   CALCIUM 9.8 12/28/2018   GFR 77.90 12/28/2018   Lab Results  Component Value Date   CHOL 236 (H) 12/28/2018   Lab Results  Component Value Date   HDL 56.70 12/28/2018   Lab Results  Component Value Date   LDLCALC 151 (H) 12/28/2018   Lab Results  Component Value Date   TRIG 142.0 12/28/2018   Lab Results  Component Value Date   CHOLHDL 4 12/28/2018   No results found for: HGBA1C    Assessment & Plan:   Problem List Items Addressed This Visit   None   Visit Diagnoses    Atypical nevi    -  Primary   Relevant Orders   Ambulatory referral to Dermatology   Physical exam       Relevant Orders   Basic metabolic panel   Lipid panel   TSH   Hepatic function panel   CBC  with Differential/Platelet   PSA    -Strongly advise dermatology referral and this was placed.  We also instructed him if he is not got an appointment within 1 month to be in touch with [place].  If he cannot get into see dermatology in the next month would proceed with going ahead with biopsy of right anterior chest lesion.  -Continue regular exercise habits  -Obtain follow-up labs as above including PSA  -Continue with annual flu vaccine  -Consider more aggressive treatment of lipids.  We will wait for labs first.  No orders of the defined types were placed in this encounter.   Follow-up: No follow-ups on file.    Carolann Littler, MD

## 2020-06-26 NOTE — Patient Instructions (Signed)

## 2021-01-25 ENCOUNTER — Other Ambulatory Visit: Payer: Self-pay | Admitting: Family Medicine

## 2021-07-02 ENCOUNTER — Encounter: Payer: Managed Care, Other (non HMO) | Admitting: Family Medicine

## 2021-07-22 ENCOUNTER — Ambulatory Visit (INDEPENDENT_AMBULATORY_CARE_PROVIDER_SITE_OTHER): Payer: Managed Care, Other (non HMO) | Admitting: Family Medicine

## 2021-07-22 ENCOUNTER — Encounter: Payer: Self-pay | Admitting: Family Medicine

## 2021-07-22 VITALS — BP 152/90 | HR 60 | Temp 98.0°F | Ht 70.47 in | Wt 217.2 lb

## 2021-07-22 DIAGNOSIS — Z0000 Encounter for general adult medical examination without abnormal findings: Secondary | ICD-10-CM | POA: Diagnosis not present

## 2021-07-22 LAB — LIPID PANEL
Cholesterol: 221 mg/dL — ABNORMAL HIGH (ref 0–200)
HDL: 65.8 mg/dL (ref >39.00)
LDL Cholesterol: 133 mg/dL — ABNORMAL HIGH (ref 0–99)
NonHDL: 155.69
Total CHOL/HDL Ratio: 3
Triglycerides: 111 mg/dL (ref 0.0–149.0)
VLDL: 22.2 mg/dL (ref 0.0–40.0)

## 2021-07-22 LAB — CBC WITH DIFFERENTIAL/PLATELET
Basophils Absolute: 0 10*3/uL (ref 0.0–0.1)
Basophils Relative: 0.5 % (ref 0.0–3.0)
Eosinophils Absolute: 0.2 10*3/uL (ref 0.0–0.7)
Eosinophils Relative: 3.5 % (ref 0.0–5.0)
HCT: 45.3 % (ref 39.0–52.0)
Hemoglobin: 15.5 g/dL (ref 13.0–17.0)
Lymphocytes Relative: 14.6 % (ref 12.0–46.0)
Lymphs Abs: 0.8 10*3/uL (ref 0.7–4.0)
MCHC: 34.3 g/dL (ref 30.0–36.0)
MCV: 92.1 fl (ref 78.0–100.0)
Monocytes Absolute: 0.6 10*3/uL (ref 0.1–1.0)
Monocytes Relative: 12.3 % — ABNORMAL HIGH (ref 3.0–12.0)
Neutro Abs: 3.6 10*3/uL (ref 1.4–7.7)
Neutrophils Relative %: 69.1 % (ref 43.0–77.0)
Platelets: 200 10*3/uL (ref 150.0–400.0)
RBC: 4.92 Mil/uL (ref 4.22–5.81)
RDW: 13.9 % (ref 11.5–15.5)
WBC: 5.2 10*3/uL (ref 4.0–10.5)

## 2021-07-22 LAB — HEPATIC FUNCTION PANEL
ALT: 32 U/L (ref 0–53)
AST: 26 U/L (ref 0–37)
Albumin: 4.5 g/dL (ref 3.5–5.2)
Alkaline Phosphatase: 66 U/L (ref 39–117)
Bilirubin, Direct: 0.2 mg/dL (ref 0.0–0.3)
Total Bilirubin: 1.1 mg/dL (ref 0.2–1.2)
Total Protein: 6.6 g/dL (ref 6.0–8.3)

## 2021-07-22 LAB — BASIC METABOLIC PANEL
BUN: 19 mg/dL (ref 6–23)
CO2: 31 mEq/L (ref 19–32)
Calcium: 10 mg/dL (ref 8.4–10.5)
Chloride: 102 mEq/L (ref 96–112)
Creatinine, Ser: 1.09 mg/dL (ref 0.40–1.50)
GFR: 72.48 mL/min (ref >60.00)
Glucose, Bld: 99 mg/dL (ref 70–99)
Potassium: 4.5 mEq/L (ref 3.5–5.1)
Sodium: 141 mEq/L (ref 135–145)

## 2021-07-22 LAB — TSH: TSH: 1.01 u[IU]/mL (ref 0.35–5.50)

## 2021-07-22 LAB — PSA: PSA: 0 ng/mL — ABNORMAL LOW (ref 0.10–4.00)

## 2021-07-22 NOTE — Patient Instructions (Signed)
Get home blood pressure cuff and monitor at least a few times per week and be in touch if consistently > 140/90.

## 2021-07-22 NOTE — Progress Notes (Signed)
Established Patient Office Visit  Subjective   Patient ID: Scott Rodgers, male    DOB: 1960/10/16  Age: 61 y.o. MRN: [id number]  Chief Complaint  Patient presents with   Annual Exam    HPI   Here for complete physical.  Generally doing well.  He has been dealing with stressors of trying to help take care of his father who has dementia who lives up in [place] and his mother lives with him currently at home. He is contemplating retirement at the end of this year.  He has hyperlipidemia treated with atorvastatin.  Takes no other regular medications.  Health maintenance reviewed:  -Colonoscopy due in a couple years -Tetanus due 2026 -Prior hepatitis C screen negative -Shingrix already completed  Family history and social history reviewed with no significant changes:  Social history-he is married.  He has 3 children.  He has 1 grandson who is about 8 months old and lives in [place].  Non-smoker.  Drinks 1 to 2 glasses of wine per day on the weekends but not daily.  He works for a company that [employer].  He currently exercises several days per week.   Family history-Father reportedly had aortic valve replacement.  No known history of CAD.  His father also had some type of dementia presumably Alzheimer's type.  His mother is 102 and alive and well.  He had a brother who died age 29 of some type of brain tumor.  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 He has never used smokeless tobacco. He reports current alcohol use. He reports that he does not use drugs. family history includes Cancer (age of onset: 58) in his brother; Dementia in his father; Heart disease in his father. No Known Allergies   Review of Systems  Constitutional:  Negative for chills, fever, malaise/fatigue and weight loss.  HENT:  Negative for hearing loss.   Eyes:  Negative for blurred vision and double vision.  Respiratory:  Negative for cough and shortness of breath.   Cardiovascular:  Negative for chest pain, palpitations and leg swelling.  Gastrointestinal:  Negative for abdominal pain, blood in stool, constipation and diarrhea.  Genitourinary:  Negative for dysuria.  Skin:  Negative for rash.  Neurological:  Negative for dizziness, speech change, seizures, loss of consciousness and headaches.  Psychiatric/Behavioral:  Negative for depression.       Objective:     BP (!) 152/90 (BP Location: Left Arm, Cuff Size: Normal)   Pulse 60   Temp 98 F (36.7 C) (Oral)   Ht 5' 10.47" (1.79 m)   Wt 217 lb 3.2 oz (98.5 kg)   SpO2 98%   BMI 30.75 kg/m    Physical Exam Constitutional:      General: He is not in acute distress.    Appearance: He is well-developed.  HENT:     Head: Normocephalic and atraumatic.     Right Ear: External ear normal.     Left Ear: External ear normal.  Eyes:     Conjunctiva/sclera: Conjunctivae normal.     Pupils: Pupils are equal, round, and reactive to light.  Neck:     Thyroid: No thyromegaly.  Cardiovascular:     Rate and Rhythm: Normal rate and regular rhythm.     Heart sounds: Normal heart sounds. No murmur heard. Pulmonary:     Effort: No respiratory distress.     Breath sounds: No wheezing or rales.  Abdominal:     General: Bowel sounds are normal. There is no distension.     Palpations: Abdomen is soft. There is no mass.     Tenderness: There is no abdominal tenderness. There is no guarding or rebound.   Musculoskeletal:     Cervical back: Normal range of motion and neck supple.  Lymphadenopathy:     Cervical: No cervical adenopathy.  Skin:    Findings: No rash.  Neurological:     Mental Status: He is alert and oriented to person, place, and time.     Cranial Nerves: No cranial nerve deficit.      No results found for any visits on 07/22/21.    The 10-year ASCVD risk score (Arnett DK, et al., 2019) is: 19%    Assessment & Plan:   Problem List Items Addressed This Visit   None Visit Diagnoses     Physical exam    -  Primary   Relevant Orders   Basic metabolic panel   Lipid panel   CBC with Differential/Platelet   TSH   Hepatic function panel     Elevated blood pressure here today initially and confirmed with repeat reading of 152/90.  No prior history of hypertension.  We discussed nonpharmacologic management.  Strongly advised to establish more consistent exercise and lose some weight.  Handout on DASH diet given.  Get home blood pressure cuff.  Monitor regularly.  If consistently greater than 140/90 next several weeks follow-up for further treatment  -Obtain screening labs as above  -Continue annual flu vaccine  -Repeat colonoscopy in a couple years  -We will need Prevnar 20 in a couple years  No follow-ups on file.    Carolann Littler, MD

## 2021-10-30 ENCOUNTER — Encounter: Payer: Self-pay | Admitting: Family Medicine

## 2021-10-30 ENCOUNTER — Ambulatory Visit: Payer: Managed Care, Other (non HMO) | Admitting: Family Medicine

## 2021-10-30 VITALS — BP 160/100 | HR 90 | Temp 97.9°F | Ht 70.47 in | Wt 208.0 lb

## 2021-10-30 DIAGNOSIS — M545 Low back pain, unspecified: Secondary | ICD-10-CM

## 2021-10-30 DIAGNOSIS — M79604 Pain in right leg: Secondary | ICD-10-CM

## 2021-10-30 MED ORDER — PREDNISONE 10 MG PO TABS: ORAL_TABLET | ORAL | 0 refills | Status: DC

## 2021-10-30 MED ORDER — CYCLOBENZAPRINE HCL 10 MG PO TABS: 10.0000 mg | ORAL_TABLET | Freq: Every day | ORAL | 0 refills | Status: DC

## 2021-10-30 NOTE — Progress Notes (Signed)
Established Patient Office Visit  Subjective   Patient ID: Scott Rodgers, male    DOB: November 28, 1960  Age: 61 y.o. MRN: [id number]  Chief Complaint  Patient presents with   Back Pain    Patient complains of lower back pain, x2 weeks, Tried Tylenol with little relief, Patient denies any known injury   Groin Pain    Patient complains of groin pain, x2 weeks     HPI   Seen with pain in the lower back region past couple weeks.  Denies any injury.  He has pain that radiates from the right lumbar area down almost more toward the right groin region but also into the right anterior thigh about midway down.  No numbness.  No weakness.  He has been able to walk without difficulty.  In fact, walk 3 miles recently without difficulty.  Pain is worse with sitting.  Has tried Tylenol without much relief.  Prior history of left total hip replacement.  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 He has never used smokeless tobacco. He reports current alcohol use. He reports that he does not use drugs. family history includes Cancer (age of onset: 71) in his brother; Dementia in his father; Heart disease in his father. No Known Allergies  Review of Systems  Constitutional:  Negative for chills and fever.  Musculoskeletal:  Positive for back pain.  Neurological:  Negative for tingling and weakness.      Objective:     BP (!) 160/100 (BP Location: Left Arm, Patient Position:  Sitting, Cuff Size: Normal)   Pulse 90   Temp 97.9 F (36.6 C) (Oral)   Ht 5' 10.47" (1.79 m)   Wt 208 lb (94.3 kg)   SpO2 98%   BMI 29.45 kg/m    Physical Exam Vitals reviewed.  Cardiovascular:     Rate and Rhythm: Normal rate and regular rhythm.  Musculoskeletal:     Comments: Excellent range of motion right hip.  Straight leg raise are negative bilaterally.  No pain with hip flexion against resistance.  No lateral hip tenderness.  Neurological:     Mental Status: He is alert.     Comments: Deep tendon reflexes are 2+ knee and 1+ ankle bilaterally.  Full strength with plantarflexion, dorsiflexion, and knee extension on the right      No results found for any visits on 10/30/21.    The 10-year ASCVD risk score (Arnett DK, et al., 2019) is: 14.4%    Assessment & Plan:   Right lumbar back pain.  Does have some radiation of pain into the right anterior thigh.  Suspect lumbar nerve root impingement.  Doubt right hip pathology.  -Discussed trial of prednisone taper and also wrote for Flexeril 10 mg nightly -Back extension exercises as tolerated. -Touch base if not improving over the next couple weeks  and follow-up sooner for any lower extremity weakness or progressive pain  No follow-ups on file.    Carolann Littler, MD

## 2021-12-04 ENCOUNTER — Ambulatory Visit: Payer: Managed Care, Other (non HMO) | Admitting: Family Medicine

## 2021-12-04 ENCOUNTER — Encounter: Payer: Self-pay | Admitting: Family Medicine

## 2021-12-04 VITALS — BP 162/98 | HR 55 | Temp 97.6°F | Ht 70.47 in | Wt 210.5 lb

## 2021-12-04 DIAGNOSIS — I10 Essential (primary) hypertension: Secondary | ICD-10-CM | POA: Diagnosis not present

## 2021-12-04 MED ORDER — LOSARTAN POTASSIUM 50 MG PO TABS: 50.0000 mg | ORAL_TABLET | Freq: Every day | ORAL | 3 refills | Status: DC

## 2021-12-04 NOTE — Progress Notes (Signed)
Established Patient Office Visit  Subjective   Patient ID: Scott Rodgers, male    DOB: [birth year]  Age: 61 y.o. MRN: [id number]  Chief Complaint  Patient presents with   Hypertension    HPI   Scott Rodgers is here to discuss recent elevated blood pressures.  He has had actually multiple readings that have been elevated in office over the past year and also he monitor this frequently when he was up in [place] visiting family last week and had readings ranging generally 867Y to 195K systolic and diastolics as high as 932.  Denies any active headache.  He has been exercising regularly.  Walks about over an hour most days.  Some cycling.  Has also scaled back alcohol intake and uses very little sodium.  Denies any dizziness.  No chest pains.  No peripheral edema.  Never treated for hypertension.  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 He has never used smokeless tobacco. He reports current alcohol use. He reports that he does not use drugs. family history includes Cancer (age of onset: 40) in his brother; Dementia in his father; Heart disease in his father. No Known Allergies  Review of Systems  Constitutional:  Negative for malaise/fatigue.  Eyes:  Negative for blurred vision.  Respiratory:  Negative for shortness of breath.   Cardiovascular:  Negative for chest pain.  Neurological:  Negative for dizziness, weakness and headaches.      Objective:     BP (!) 162/98 (BP  Location: Left Arm, Cuff Size: Normal)   Pulse (!) 55   Temp 97.6 F (36.4 C) (Oral)   Ht 5' 10.47" (1.79 m)   Wt 210 lb 8 oz (95.5 kg)   SpO2 98%   BMI 29.80 kg/m  BP Readings from Last 3 Encounters:  12/04/21 (!) 162/98  10/30/21 (!) 160/100  07/22/21 (!) 152/90   Wt Readings from Last 3 Encounters:  12/04/21 210 lb 8 oz (95.5 kg)  10/30/21 208 lb (94.3 kg)  07/22/21 217 lb 3.2 oz (98.5 kg)      Physical Exam Vitals reviewed.  Constitutional:      Appearance: He is well-developed.  HENT:     Right Ear: External ear normal.     Left Ear: External ear normal.  Eyes:     Pupils: Pupils are equal, round, and reactive to light.  Neck:     Thyroid: No thyromegaly.  Cardiovascular:     Rate and Rhythm: Normal rate and regular rhythm.  Pulmonary:     Effort: Pulmonary effort is normal. No respiratory distress.     Breath sounds: Normal breath sounds. No wheezing or rales.  Musculoskeletal:     Cervical back: Neck supple.     Right lower leg: No edema.     Left lower leg: No edema.  Neurological:  Mental Status: He is alert and oriented to person, place, and time.      No results found for any visits on 12/04/21.    The 10-year ASCVD risk score (Arnett DK, et al., 2019) is: 17.1%    Assessment & Plan:   Problem List Items Addressed This Visit   None Visit Diagnoses     Essential hypertension    -  Primary   Relevant Medications   losartan (COZAAR) 50 MG tablet     Patient has had multiple blood pressure readings over 140/90 both here and in other settings over the past year.  He has made some positive lifestyle changes recently but still has elevated blood pressure today.  Repeat after rest was actually increased today with a reading of 162/98.  -Continue healthy lifestyle changes with weight control, exercise, sodium reduction -Handout on DASH diet given -Start losartan 50 mg once daily and set up follow-up in 3 weeks to reassess.  Return in about 1  month (around 01/04/2022).    Carolann Littler, MD

## 2021-12-20 ENCOUNTER — Encounter: Payer: Self-pay | Admitting: Family Medicine

## 2021-12-20 ENCOUNTER — Ambulatory Visit: Payer: Managed Care, Other (non HMO) | Admitting: Family Medicine

## 2021-12-20 VITALS — BP 140/84 | HR 65 | Temp 97.6°F | Ht 70.47 in | Wt 213.9 lb

## 2021-12-20 DIAGNOSIS — I10 Essential (primary) hypertension: Secondary | ICD-10-CM | POA: Diagnosis not present

## 2021-12-20 NOTE — Progress Notes (Signed)
   Established Patient Office Visit  Subjective   Patient ID: Scott Rodgers, male    DOB: 16-Nov-1960  Age: 61 y.o. MRN: [id number]  Chief Complaint  Patient presents with   Follow-up    HPI   Scott Rodgers is seen for follow-up hypertension.  We just recently started losartan 50 mg daily.  Blood pressure last visit was 162/98.  Tolerating well with no side effects.  He is staying very active with hiking and exercise.  Trying to watch sodium intake.  Compliant with therapy.  No dizziness.  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 He has never used smokeless tobacco. He reports current alcohol use. He reports that he does not use drugs. family history includes Cancer (age of onset: 79) in his brother; Dementia in his father; Heart disease in his father. No Known Allergies  Review of Systems  Constitutional:  Negative for malaise/fatigue.  Eyes:  Negative for blurred vision.  Respiratory:  Negative for shortness of breath.   Cardiovascular:  Negative for chest pain.  Neurological:  Negative for dizziness, weakness and headaches.      Objective:     BP 134/82 (BP Location: Left Arm, Patient Position: Sitting, Cuff Size: Normal)   Pulse 65   Temp 97.6 F (36.4 C) (Oral)   Ht 5' 10.47" (1.79 m)   Wt 213 lb 14.4 oz (97 kg)   SpO2 99%   BMI 30.28 kg/m  BP Readings from Last 3 Encounters:  12/20/21 (!) 140/84  12/04/21 (!) 162/98  10/30/21 (!) 160/100   Wt Readings  from Last 3 Encounters:  12/20/21 213 lb 14.4 oz (97 kg)  12/04/21 210 lb 8 oz (95.5 kg)  10/30/21 208 lb (94.3 kg)      Physical Exam Vitals reviewed.  Constitutional:      Appearance: Normal appearance.  Cardiovascular:     Rate and Rhythm: Normal rate and regular rhythm.  Pulmonary:     Effort: Pulmonary effort is normal.     Breath sounds: Normal breath sounds. No wheezing or rales.  Neurological:     Mental Status: He is alert.      No results found for any visits on 12/20/21.    The 10-year ASCVD risk score (Arnett DK, et al., 2019) is: 12.5%    Assessment & Plan:   Hypertension-improving with recent addition of losartan.  Repeat left arm seated after rest 140/84.  Continue healthy lifestyle changes.  We will go ahead and bump losartan up to 100 mg daily.  We will set up follow-up in about 1 month to reassess  Return in about 1 month (around 01/19/2022).    Carolann Littler, MD

## 2021-12-20 NOTE — Patient Instructions (Signed)
Increase the Losartan to 100 mg daily

## 2022-01-13 ENCOUNTER — Telehealth: Payer: Self-pay | Admitting: Family Medicine

## 2022-01-13 MED ORDER — LOSARTAN POTASSIUM 100 MG PO TABS: 100.0000 mg | ORAL_TABLET | Freq: Every day | ORAL | 3 refills | Status: DC

## 2022-01-13 NOTE — Telephone Encounter (Signed)
Rx sent 

## 2022-01-13 NOTE — Telephone Encounter (Signed)
Pt called to request a refill of the  losartan (COZAAR) 100 MG tablet    Preferred Pharmacies   [id number] [place]: [phone] Fax: [phone]    Pt says he is will be out of this medication by mid-January.  LOV:  12/20/21

## 2022-01-17 ENCOUNTER — Ambulatory Visit: Payer: Managed Care, Other (non HMO) | Admitting: Family Medicine

## 2022-01-17 ENCOUNTER — Encounter: Payer: Self-pay | Admitting: Family Medicine

## 2022-01-17 VITALS — BP 136/84 | HR 75 | Temp 97.8°F | Ht 70.47 in | Wt 214.7 lb

## 2022-01-17 DIAGNOSIS — I10 Essential (primary) hypertension: Secondary | ICD-10-CM | POA: Diagnosis not present

## 2022-01-17 NOTE — Patient Instructions (Signed)
Try to lose some weight and let's monitor blood pressure for now  Set up follow up within the next couple of months.

## 2022-01-17 NOTE — Progress Notes (Signed)
Established Patient Office Visit  Subjective   Patient ID: Scott Rodgers, male    DOB: 1961-01-14  Age: 61 y.o. MRN: [id number]  Chief Complaint  Patient presents with   Follow-up    HPI   Here for follow-up hypertension.  We bumped his losartan up to 100 mg daily last visit.  He does got back from [place] after being there with much of his family for a few weeks.  Little more alcohol than usual and has gained a few pounds.  He plans to start back more diligent plan with exercise and diet now that he is back.  Home blood pressures have been mostly high 196Q systolic and high 22L diastolic.  Compliant with medication.  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 He has never used smokeless tobacco. He reports current alcohol use. He reports that he does not use drugs. family history includes Cancer (age of onset: 20) in his brother; Dementia in his father; Heart disease in his father. No Known Allergies  Review of Systems  Constitutional:  Negative for malaise/fatigue.  Eyes:  Negative for blurred vision.  Respiratory:  Negative for shortness of breath.   Cardiovascular:  Negative for chest pain.  Neurological:  Negative for dizziness, weakness and headaches.      Objective:     BP 136/84 (BP Location: Left Arm, Patient Position: Sitting, Cuff Size: Large)   Pulse 75   Temp 97.8 F (36.6 C) (Oral)   Ht 5' 10.47" (1.79 m)   Wt 214 lb 11.2 oz (97.4 kg)    SpO2 96%   BMI 30.40 kg/m    Physical Exam Vitals reviewed.  Constitutional:      Appearance: He is well-developed.  HENT:     Right Ear: External ear normal.     Left Ear: External ear normal.  Eyes:     Pupils: Pupils are equal, round, and reactive to light.  Neck:     Thyroid: No thyromegaly.  Cardiovascular:     Rate and Rhythm: Normal rate and regular rhythm.  Pulmonary:     Effort: Pulmonary effort is normal. No respiratory distress.     Breath sounds: Normal breath sounds. No wheezing or rales.  Musculoskeletal:     Cervical back: Neck supple.  Neurological:     Mental Status: He is alert and oriented to person, place, and time.      No results found for any visits on 01/17/22.    The 10-year ASCVD risk score (Arnett DK, et al., 2019) is: 12.8%    Assessment & Plan:   Problem List Items Addressed This Visit       Unprioritized   Essential hypertension - Primary   Borderline control on losartan 100 mg daily.  We gave option of adding additional medication versus lifestyle modification and weight loss and  getting back to more consistent exercise and he prefers the latter.  Will set up follow-up in a couple months to reassess. No follow-ups on file.    Carolann Littler, MD

## 2022-02-05 ENCOUNTER — Other Ambulatory Visit: Payer: Self-pay | Admitting: Family Medicine

## 2022-05-02 ENCOUNTER — Other Ambulatory Visit: Payer: Self-pay | Admitting: Family Medicine

## 2022-07-28 ENCOUNTER — Other Ambulatory Visit: Payer: Self-pay | Admitting: Family Medicine

## 2022-08-22 ENCOUNTER — Other Ambulatory Visit: Payer: Self-pay | Admitting: Family Medicine

## 2022-09-03 ENCOUNTER — Ambulatory Visit (INDEPENDENT_AMBULATORY_CARE_PROVIDER_SITE_OTHER): Payer: BC Managed Care – PPO | Admitting: Family Medicine

## 2022-09-03 ENCOUNTER — Encounter: Payer: Self-pay | Admitting: Family Medicine

## 2022-09-03 VITALS — BP 130/90 | HR 76 | Temp 98.2°F | Ht 69.69 in | Wt 207.3 lb

## 2022-09-03 DIAGNOSIS — Z0000 Encounter for general adult medical examination without abnormal findings: Secondary | ICD-10-CM

## 2022-09-03 LAB — CBC WITH DIFFERENTIAL/PLATELET
Basophils Absolute: 0 10*3/uL (ref 0.0–0.1)
Basophils Relative: 0.5 % (ref 0.0–3.0)
Eosinophils Absolute: 0.1 10*3/uL (ref 0.0–0.7)
Eosinophils Relative: 1.2 % (ref 0.0–5.0)
HCT: 44.7 % (ref 39.0–52.0)
Hemoglobin: 15.2 g/dL (ref 13.0–17.0)
Lymphocytes Relative: 15.2 % (ref 12.0–46.0)
Lymphs Abs: 1 10*3/uL (ref 0.7–4.0)
MCHC: 34 g/dL (ref 30.0–36.0)
MCV: 93.4 fl (ref 78.0–100.0)
Monocytes Absolute: 0.7 10*3/uL (ref 0.1–1.0)
Monocytes Relative: 11 % (ref 3.0–12.0)
Neutro Abs: 4.8 10*3/uL (ref 1.4–7.7)
Neutrophils Relative %: 72.1 % (ref 43.0–77.0)
Platelets: 284 10*3/uL (ref 150.0–400.0)
RBC: 4.79 Mil/uL (ref 4.22–5.81)
RDW: 13.7 % (ref 11.5–15.5)
WBC: 6.7 10*3/uL (ref 4.0–10.5)

## 2022-09-03 LAB — BASIC METABOLIC PANEL
BUN: 14 mg/dL (ref 6–23)
CO2: 27 mEq/L (ref 19–32)
Calcium: 10.3 mg/dL (ref 8.4–10.5)
Chloride: 104 mEq/L (ref 96–112)
Creatinine, Ser: 0.96 mg/dL (ref 0.40–1.50)
GFR: 83.75 mL/min (ref >60.00)
Glucose, Bld: 86 mg/dL (ref 70–99)
Potassium: 4.2 mEq/L (ref 3.5–5.1)
Sodium: 141 mEq/L (ref 135–145)

## 2022-09-03 LAB — LIPID PANEL
Cholesterol: 222 mg/dL — ABNORMAL HIGH (ref 0–200)
HDL: 67.7 mg/dL (ref >39.00)
LDL Cholesterol: 131 mg/dL — ABNORMAL HIGH (ref 0–99)
NonHDL: 153.92
Total CHOL/HDL Ratio: 3
Triglycerides: 115 mg/dL (ref 0.0–149.0)
VLDL: 23 mg/dL (ref 0.0–40.0)

## 2022-09-03 LAB — HEPATIC FUNCTION PANEL
ALT: 29 U/L (ref 0–53)
AST: 26 U/L (ref 0–37)
Albumin: 4.8 g/dL (ref 3.5–5.2)
Alkaline Phosphatase: 65 U/L (ref 39–117)
Bilirubin, Direct: 0.2 mg/dL (ref 0.0–0.3)
Total Bilirubin: 1.1 mg/dL (ref 0.2–1.2)
Total Protein: 7.4 g/dL (ref 6.0–8.3)

## 2022-09-03 LAB — PSA: PSA: 0 ng/mL — ABNORMAL LOW (ref 0.10–4.00)

## 2022-09-03 MED ORDER — AMLODIPINE BESYLATE 5 MG PO TABS: 5.0000 mg | ORAL_TABLET | Freq: Every day | ORAL | 3 refills | Status: DC

## 2022-09-03 NOTE — Progress Notes (Signed)
Established Patient Office Visit  Subjective   Patient ID: Scott Rodgers, male    DOB: Dec 23, 1960  Age: 62 y.o. MRN: [id number]  Chief Complaint  Patient presents with   Annual Exam    HPI   Scott Rodgers is seen for annual physical exam.  Has history of hypertension, prostate cancer, hyperlipidemia, and osteoarthritis.  He remains on atorvastatin and losartan.  His prostate cancer was treated over 10 years ago.  PSAs have been 0 since then.  He has had significant life events since last physical.  His father died age 27 in 2023-01-17 complications of dementia.  He had a brother that died age 38 in 2022-06-17 of acute leukemia.  He was just diagnosed in November. Scott Rodgers retired in January.  Health maintenance reviewed:  -Shingrix vaccine completed -Hepatitis C negative previously -Tetanus due in 2 years -Colonoscopy due 12/25  Social history-he is married.  He has 3 children.  He has 1 grandson who is about 55 years old and lives in [place].  Non-smoker.  Drinks 1 to 2 glasses of wine per day on the weekends but not daily.  Retired January of this year.   Family history-Father reportedly had aortic valve replacement.  No known history of CAD.  His father also had some type of dementia presumably Alzheimer's type-died back in 2023-01-17 age 91.  His mother is 50 and alive and well.  He had a brother who died age 35 of some type of brain tumor.  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 He has never used smokeless tobacco. He reports current alcohol use. He reports that he does not use drugs. family history includes Cancer (age of onset: 7) in his brother; Dementia in his father; Heart disease in his father. No Known Allergies     Review of Systems  Constitutional:  Negative for chills, fever, malaise/fatigue and weight loss.  HENT:  Negative for hearing loss.   Eyes:  Negative for blurred vision and double vision.  Respiratory:  Negative for cough and shortness of breath.   Cardiovascular:  Negative for chest pain, palpitations and leg swelling.  Gastrointestinal:  Negative for abdominal pain, blood in stool, constipation and diarrhea.  Genitourinary:  Negative for dysuria.  Skin:  Negative for rash.  Neurological:  Negative for dizziness, speech change, seizures, loss of consciousness and headaches.  Psychiatric/Behavioral:  Negative for depression.       Objective:     BP (!) 130/90 (BP Location: Left Arm, Patient Position: Sitting, Cuff Size: Normal)   Pulse 76   Temp 98.2 F (36.8 C) (Oral)   Ht 5' 9.69" (1.77 m)   Wt 207 lb 4.8 oz (94 kg)   SpO2 98%   BMI 30.01 kg/m  BP Readings from Last 3 Encounters:  09/03/22 Marland Kitchen)  130/90  01/17/22 136/84  12/20/21 (!) 140/84   Wt Readings from Last 3 Encounters:  09/03/22 207 lb 4.8 oz (94 kg)  01/17/22 214 lb 11.2 oz (97.4 kg)  12/20/21 213 lb 14.4 oz (97 kg)      Physical Exam Vitals reviewed.  Constitutional:      General: He is not in acute distress.    Appearance: He is well-developed.  HENT:     Head: Normocephalic and atraumatic.     Right Ear: External ear normal.     Left Ear: External ear normal.  Eyes:     Conjunctiva/sclera: Conjunctivae normal.     Pupils: Pupils are equal, round, and reactive to light.  Neck:     Thyroid: No thyromegaly.  Cardiovascular:     Rate and Rhythm: Normal rate and regular rhythm.     Heart sounds: Normal heart  sounds. No murmur heard. Pulmonary:     Effort: No respiratory distress.     Breath sounds: No wheezing or rales.  Abdominal:     General: Bowel sounds are normal. There is no distension.     Palpations: Abdomen is soft. There is no mass.     Tenderness: There is no abdominal tenderness. There is no guarding or rebound.  Musculoskeletal:     Cervical back: Normal range of motion and neck supple.     Right lower leg: No edema.     Left lower leg: No edema.  Lymphadenopathy:     Cervical: No cervical adenopathy.  Skin:    Findings: No rash.  Neurological:     Mental Status: He is alert and oriented to person, place, and time.     Cranial Nerves: No cranial nerve deficit.      No results found for any visits on 09/03/22.  Last CBC Lab Results  Component Value Date   WBC 5.2 07/22/2021   HGB 15.5 07/22/2021   HCT 45.3 07/22/2021   MCV 92.1 07/22/2021   MCH 31.2 05/07/2012   RDW 13.9 07/22/2021   PLT 200.0 07/22/2021   Last metabolic panel Lab Results  Component Value Date   GLUCOSE 99 07/22/2021   NA 141 07/22/2021   K 4.5 07/22/2021   CL 102 07/22/2021   CO2 31 07/22/2021   BUN 19 07/22/2021   CREATININE 1.09 07/22/2021   GFR 72.48 07/22/2021   CALCIUM 10.0 07/22/2021   PROT 6.6 07/22/2021   ALBUMIN 4.5 07/22/2021   BILITOT 1.1 07/22/2021   ALKPHOS 66 07/22/2021   AST 26 07/22/2021   ALT 32 07/22/2021   Last lipids Lab Results  Component Value Date   CHOL 221 (H) 07/22/2021   HDL 65.80 07/22/2021   LDLCALC 133 (H) 07/22/2021   TRIG 111.0 07/22/2021   CHOLHDL 3 07/22/2021   Last hemoglobin A1c No results found for: "HGBA1C" Last thyroid functions Lab Results  Component Value Date   TSH 1.01 07/22/2021      The 10-year ASCVD risk score (Arnett DK, et al., 2019) is: 12.9%    Assessment & Plan:   Problem List Items Addressed This Visit   None Visit Diagnoses     Physical exam    -  Primary   Relevant Orders   Basic metabolic panel   Lipid  panel   CBC with Differential/Platelet   Hepatic function panel   PSA     He has hypertension which is poorly controlled.  Repeat blood pressure left arm seated after rest 142/100.  Recommend add  amlodipine 5 mg daily to losartan 100 mg daily.  Continue close home monitoring.  Continue regular exercise habits and sodium reduction.  Reassess within 2 months  -Continue annual flu vaccine  -Obtain labs including PSA with history of prostate cancer  -Will need repeat colonoscopy by 2025  Return in about 2 months (around 11/04/2022).    Evelena Peat, MD

## 2022-09-16 ENCOUNTER — Other Ambulatory Visit: Payer: Self-pay | Admitting: Family Medicine

## 2022-09-23 ENCOUNTER — Other Ambulatory Visit: Payer: Self-pay | Admitting: Family Medicine

## 2023-01-27 ENCOUNTER — Other Ambulatory Visit: Payer: Self-pay | Admitting: Family Medicine

## 2023-03-13 ENCOUNTER — Ambulatory Visit: Payer: BC Managed Care – PPO | Admitting: Family Medicine

## 2023-03-13 ENCOUNTER — Encounter: Payer: Self-pay | Admitting: Family Medicine

## 2023-03-13 VITALS — BP 132/86 | HR 70 | Temp 97.9°F | Wt 223.0 lb

## 2023-03-13 DIAGNOSIS — I10 Essential (primary) hypertension: Secondary | ICD-10-CM

## 2023-03-13 MED ORDER — HYDROCHLOROTHIAZIDE 12.5 MG PO CAPS: 12.5000 mg | ORAL_CAPSULE | Freq: Every day | ORAL | 3 refills | Status: DC

## 2023-03-13 NOTE — Patient Instructions (Addendum)
Add the hydrochlorothiazide 12.5 mg once daily  Set up one month follow up. 0

## 2023-03-13 NOTE — Progress Notes (Signed)
Established Patient Office Visit  Subjective   Patient ID: Scott Rodgers, male    DOB: 03-Jul-1960  Age: 63 y.o. MRN: [id number]  Chief Complaint  Patient presents with   Medical Management of Chronic Issues    HPI    Scott Rodgers is seen with persistently elevated blood pressure.  Currently on losartan 100 mg daily and amlodipine 5 mg daily.  He has been diligent with walking 2 to 7 miles per day but surprisingly his weight is up some.  No peripheral edema.  Compliant with medications.  No headaches or dizziness.  Home blood pressures have been fairly consistently 130s to 140s systolic and 95-100 diastolic.  No regular alcohol.  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 He has never used smokeless tobacco. He reports current alcohol use. He reports that he does not use drugs. family history includes Cancer (age of onset: 72) in his brother; Dementia in his father; Heart disease in his father. No Known Allergies  Review of Systems  Constitutional:  Negative for malaise/fatigue.  Eyes:  Negative for blurred vision.  Respiratory:  Negative for shortness of breath.   Cardiovascular:  Negative for chest pain.  Neurological:  Negative for dizziness, weakness and headaches.      Objective:     BP 132/86 (BP Location: Left Arm, Cuff Size: Normal)   Pulse 70   Temp 97.9 F (36.6 C) (Oral)   Wt 223 lb (101.2 kg)   SpO2 97%   BMI 32.29 kg/m  BP Readings from Last 3  Encounters:  03/13/23 132/86  09/04/22 (!) 130/90  01/17/22 136/84   Wt Readings from Last 3 Encounters:  03/13/23 223 lb (101.2 kg)  09/03/22 207 lb 4.8 oz (94 kg)  01/17/22 214 lb 11.2 oz (97.4 kg)      Physical Exam Vitals reviewed.  Constitutional:      General: He is not in acute distress.    Appearance: He is well-developed. He is not ill-appearing.  Eyes:     Pupils: Pupils are equal, round, and reactive to light.  Neck:     Thyroid: No thyromegaly.  Cardiovascular:     Rate and Rhythm: Normal rate and regular rhythm.  Pulmonary:     Effort: Pulmonary effort is normal. No respiratory distress.     Breath sounds: Normal breath sounds. No wheezing or rales.  Musculoskeletal:     Cervical back: Neck supple.     Right lower leg: No edema.     Left lower leg: No edema.  Neurological:     Mental Status: He is alert and oriented to person, place, and time.      No results found for any visits on 03/13/23.    The 10-year ASCVD risk score (Arnett DK, et al., 2019) is: 13%    Assessment & Plan:   Hypertension.  Suboptimally controlled.  Continue current regimen of losartan and amlodipine.  Add low-dose HCTZ 12.5 mg once daily.  Continue low-sodium diet.  Continue regular exercise habits.  Set up follow-up in 3 to 4 weeks to reassess and check basic metabolic panel at that time.  Increase potassium rich foods  Evelena Peat, MD

## 2023-05-19 DIAGNOSIS — I10 Essential (primary) hypertension: Secondary | ICD-10-CM | POA: Diagnosis not present

## 2023-05-19 DIAGNOSIS — S90852A Superficial foreign body, left foot, initial encounter: Secondary | ICD-10-CM | POA: Diagnosis not present

## 2023-05-19 DIAGNOSIS — W458XXA Other foreign body or object entering through skin, initial encounter: Secondary | ICD-10-CM | POA: Diagnosis not present

## 2023-05-19 DIAGNOSIS — Y9301 Activity, walking, marching and hiking: Secondary | ICD-10-CM | POA: Diagnosis not present

## 2023-05-19 DIAGNOSIS — Z23 Encounter for immunization: Secondary | ICD-10-CM | POA: Diagnosis not present

## 2023-05-19 DIAGNOSIS — Z79899 Other long term (current) drug therapy: Secondary | ICD-10-CM | POA: Diagnosis not present

## 2023-05-27 ENCOUNTER — Other Ambulatory Visit: Payer: Self-pay | Admitting: Family Medicine

## 2023-06-02 ENCOUNTER — Ambulatory Visit: Admitting: Family Medicine

## 2023-06-02 ENCOUNTER — Encounter: Payer: Self-pay | Admitting: Family Medicine

## 2023-06-02 VITALS — BP 150/98 | HR 73 | Temp 97.5°F | Ht 69.6 in | Wt 220.2 lb

## 2023-06-02 DIAGNOSIS — I10 Essential (primary) hypertension: Secondary | ICD-10-CM

## 2023-06-02 DIAGNOSIS — S90852D Superficial foreign body, left foot, subsequent encounter: Secondary | ICD-10-CM

## 2023-06-02 LAB — BASIC METABOLIC PANEL WITH GFR
BUN: 24 mg/dL — ABNORMAL HIGH (ref 6–23)
CO2: 31 meq/L (ref 19–32)
Calcium: 9.8 mg/dL (ref 8.4–10.5)
Chloride: 99 meq/L (ref 96–112)
Creatinine, Ser: 1.38 mg/dL (ref 0.40–1.50)
GFR: 53.9 mL/min — ABNORMAL LOW (ref >60.00)
Glucose, Bld: 81 mg/dL (ref 70–99)
Potassium: 4.5 meq/L (ref 3.5–5.1)
Sodium: 137 meq/L (ref 135–145)

## 2023-06-02 MED ORDER — AMLODIPINE BESYLATE 10 MG PO TABS: 10.0000 mg | ORAL_TABLET | Freq: Every day | ORAL | 3 refills | Status: AC

## 2023-06-02 MED ORDER — LOSARTAN POTASSIUM 100 MG PO TABS: 100.0000 mg | ORAL_TABLET | Freq: Every day | ORAL | 3 refills | Status: AC

## 2023-06-02 NOTE — Progress Notes (Signed)
 Established Patient Office Visit  Subjective   Patient ID: Scott Rodgers, male    DOB: 1961/01/11  Age: 63 y.o. MRN: [id number]  Chief Complaint  Patient presents with   Hypertension    HPI   Scott Rodgers is seen for follow-up on hypertension.  Currently on amlodipine  5 mg daily, HCTZ 12.5 mg daily, and losartan  100 mg daily.  We had added HCTZ last visit.  Denies any side effects.  Weight down 3 pounds from last visit.  He and his wife just got back from [place].  Has been doing a fair amount of walking.  Did have injury with large 6 inch splinter into the left foot couple weeks ago.  Went to ER there and this had to be removed by physician.  Patient placed on Septra DS.  Still some soreness but no redness or warmth.  Overall some improved.  Home blood pressures have been improved but still up here today.  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 He has never used smokeless tobacco. He reports current alcohol use. He reports that he does not use drugs. family history includes Cancer (age of onset: 23) in his brother; Dementia in his father; Heart disease in his father. No Known Allergies  Review of Systems  Constitutional:  Negative for malaise/fatigue.  Eyes:  Negative for blurred vision.  Respiratory:  Negative for shortness of breath.   Cardiovascular:  Negative for chest pain.  Neurological:  Negative for dizziness, weakness and headaches.      Objective:      BP (!) 160/100 (BP Location: Left Arm, Patient Position: Sitting, Cuff Size: Normal)   Pulse 73   Temp (!) 97.5 F (36.4 C) (Oral)   Ht 5' 9.6" (1.768 m)   Wt 220 lb 3.2 oz (99.9 kg)   SpO2 97%   BMI 31.96 kg/m  BP Readings from Last 3 Encounters:  06/02/23 (!) 150/98  03/13/23 132/86  09/04/22 (!) 130/90   Wt Readings from Last 3 Encounters:  06/02/23 220 lb 3.2 oz (99.9 kg)  03/13/23 223 lb (101.2 kg)  09/03/22 207 lb 4.8 oz (94 kg)      Physical Exam Vitals reviewed.  Constitutional:      General: He is not in acute distress.    Appearance: He is not ill-appearing.  Cardiovascular:     Rate and Rhythm: Normal rate and regular rhythm.  Pulmonary:     Effort: Pulmonary effort is normal.     Breath sounds: Normal breath sounds. No wheezing or rales.  Musculoskeletal:     Right lower leg: No edema.     Left lower leg: No edema.  Skin:    Comments: Left foot reveals punctate mark from entry wound of recent splinter.  Still has a little bit of mild swelling but no erythema.  No warmth.  No fluctuance.  Neurological:     Mental Status: He is alert.      No results found for any visits on 06/02/23.    The 10-year ASCVD risk score (Arnett DK, et al., 2019) is: 17.9%    Assessment & Plan:   #1 hypertension suboptimally controlled.  Has had some improvement in the home readings with recent addition of HCTZ but still up substantially here today with blood pressure being 150/98 even after rest.  We discussed nonpharmacologic management with recommendation for additional weight loss and continue regular aerobic exercise and watching sodium intake.  Increase amlodipine  to 10 mg daily.  Continue HCTZ and losartan .  Refill losartan  for 1 year.  Check basic metabolic panel with recent initiation of HCTZ.  Set up 57-month follow-up to reassess  #2 recent foreign body left foot.  No signs of secondary infection at this time.  Still has some mild residual soreness.  Follow-up  promptly for any erythema, warmth, swelling, or other concerns  Return in about 3 months (around 09/01/2023).    Glean Lamy, MD

## 2023-06-02 NOTE — Patient Instructions (Signed)
 Increase the Amlodipine  to 10 mg daily (new Rx sent)  Set up 3 month follow up.

## 2023-06-05 ENCOUNTER — Telehealth: Payer: Self-pay | Admitting: Family Medicine

## 2023-06-05 NOTE — Telephone Encounter (Signed)
 Copied from CRM [id number]. Topic: Clinical - Lab/Test Results >> Jun 05, 2023  9:01 AM Zina Hilts wrote: Reason for CRM: Patient returning a call from [facility] regarding lab results

## 2023-06-05 NOTE — Telephone Encounter (Signed)
 Please see result note

## 2023-06-08 ENCOUNTER — Encounter: Payer: Self-pay | Admitting: Family Medicine

## 2023-06-08 DIAGNOSIS — S90852D Superficial foreign body, left foot, subsequent encounter: Secondary | ICD-10-CM

## 2023-06-15 ENCOUNTER — Ambulatory Visit

## 2023-06-15 ENCOUNTER — Encounter: Payer: Self-pay | Admitting: Podiatry

## 2023-06-15 ENCOUNTER — Ambulatory Visit (INDEPENDENT_AMBULATORY_CARE_PROVIDER_SITE_OTHER)

## 2023-06-15 ENCOUNTER — Ambulatory Visit: Admitting: Podiatry

## 2023-06-15 DIAGNOSIS — M795 Residual foreign body in soft tissue: Secondary | ICD-10-CM

## 2023-06-15 NOTE — Progress Notes (Addendum)
  Subjective:  Patient ID: Scott Rodgers, male    DOB: [birth year],  MRN: [id number]  Chief Complaint  Patient presents with   Foot Pain    x-ray/order is in- put marker on spot NP - splinter in ball of left foot    Discussed the use of AI scribe software for clinical note transcription with the patient, who gave verbal consent to proceed.  History of Present Illness Scott Rodgers is a 63 year old male who presents with persistent tenderness in the left foot following a puncture injury. He was referred by Dr. Matias to verify no foreign bodies remain in the foot.  Approximately one month ago, he sustained a puncture injury to his left foot after stepping on a board in [place]. The foreign object was removed at [facility] under local anesthesia with Novocaine, requiring several tugs with pliers.  He experiences persistent discomfort and tenderness in the left foot, especially in the morning. The sensation is described as 'cuffed' and improves with stretching. Tenderness increases when walking on uneven surfaces or wearing flip flops, but decreases with sneakers. Recently, there has been a decrease in tenderness.  There is no swelling, redness, or heat in the affected area. He has not been taking pain medication regularly, only using tramadol  on the first day. He completed a ten-day course of antibiotics, possibly Bactrim, for staph coverage.  He soaks the foot in Epsom salt twice daily. There is concern about a possible remaining shard in the foot, as the skin has healed over the area.      Objective:    Physical Exam General: AAO x3, NAD  Dermatological: On the plantar aspect of the foot there are 2 hyperkeratotic lesions from with the puncture wounds were at.  Callused over and upon debridement there is new, healthy, pink skin present.  There is no drainage or pus or any fluctuation, crepitation, malodor.  Vascular: Dorsalis Pedis artery and  Posterior Tibial artery pedal pulses are 2/4 bilateral with immedate capillary fill time. There is no pain with calf compression, swelling, warmth, erythema.   Neruologic: Grossly intact via light touch bilateral.   Musculoskeletal: Tenderness along the area of the previous foreign body.        Results  RADIOLOGY Left foot x-ray: No foreign bodies detected.  Skin marker is utilized to identify the area of the puncture wounds.    Assessment:   1. Residual foreign body in soft tissue      Plan:  Patient was evaluated and treated and all questions answered.  Assessment and Plan Assessment & Plan Foreign body in left foot Persistent tenderness post-foreign body removal. X-rays negative for retained fragments, but wood may not be visible. Possible retained fragment or healing issue. Discussed x-ray limitations and potential retained shard. -Sharply debrided the hyperkeratotic lesions without any complications or bleeding. - Order left foot ultrasound at [facility]; consider [facility] if unavailable. - Advise monitoring for infection signs: increased swelling, redness, drainage, red streaks.   Return for ultrasound result .   Donnice JONELLE Fees DPM

## 2023-06-16 ENCOUNTER — Telehealth: Payer: Self-pay | Admitting: Podiatry

## 2023-06-16 NOTE — Telephone Encounter (Signed)
 Patient called regarding referral at [facility], ([facility]) are no longer being done. Patient needs assistance with getting Ultra [facility]. Patient contact telephone number, [phone]

## 2023-06-18 ENCOUNTER — Ambulatory Visit
Admission: RE | Admit: 2023-06-18 | Discharge: 2023-06-18 | Disposition: A | Discharge status: Home / Self Care | Source: Ambulatory Visit | Attending: Podiatry | Admitting: Podiatry

## 2023-06-18 DIAGNOSIS — M795 Residual foreign body in soft tissue: Secondary | ICD-10-CM

## 2023-06-28 ENCOUNTER — Ambulatory Visit: Payer: Self-pay | Admitting: Podiatry

## 2023-06-29 ENCOUNTER — Telehealth: Payer: Self-pay | Admitting: Podiatry

## 2023-06-29 NOTE — Telephone Encounter (Signed)
 Pt is currently scheduled for surgery on 5/21 and is aware and I have sent him the address for the surgery center to his my chart account. He is aware that if we have an issue with the authorization he will be contacted.   I also included in the my chart message a note letting pt know Dr Clydia Dart would be calling him this afternoon to discuss the surgery in detail.

## 2023-06-29 NOTE — Telephone Encounter (Signed)
  DOS: 07/01/2023  (LT) INCISION AND REMOVAL OF FOREIGN OBJECT COMPLICATED-10121     EFFECTIVE DATE : 02/11/23  DEDUCTIBLE :  $4,000.00  REMAINING:   $1,410.88  OOP:   $9,200.00   REMAINING: $6,255.70   COINSURANCE: 50%      PER [place] OF [employer] NO PRIOR AUTH IS REQ FOR CPT CODE 72536  [id number]

## 2023-06-29 NOTE — Telephone Encounter (Signed)
 Dawn, can we try to add him on for Wed? If he cannot come in before then, I can discuss with him over the phone if needed and signs paperwork Wed

## 2023-07-01 ENCOUNTER — Other Ambulatory Visit: Payer: Self-pay | Admitting: Podiatry

## 2023-07-01 DIAGNOSIS — M795 Residual foreign body in soft tissue: Secondary | ICD-10-CM | POA: Diagnosis not present

## 2023-07-01 DIAGNOSIS — S91342A Puncture wound with foreign body, left foot, initial encounter: Secondary | ICD-10-CM | POA: Diagnosis not present

## 2023-07-01 DIAGNOSIS — S91232A Puncture wound without foreign body of left great toe with damage to nail, initial encounter: Secondary | ICD-10-CM | POA: Diagnosis not present

## 2023-07-01 HISTORY — PX: FOOT SURGERY: SHX648

## 2023-07-01 MED ORDER — PROMETHAZINE HCL 25 MG PO TABS: 25.0000 mg | ORAL_TABLET | Freq: Three times a day (TID) | ORAL | 0 refills | Status: DC | PRN

## 2023-07-01 MED ORDER — HYDROCODONE-ACETAMINOPHEN 5-325 MG PO TABS: 1.0000 | ORAL_TABLET | Freq: Four times a day (QID) | ORAL | 0 refills | Status: DC | PRN

## 2023-07-01 MED ORDER — CEPHALEXIN 500 MG PO CAPS: 500.0000 mg | ORAL_CAPSULE | Freq: Three times a day (TID) | ORAL | 0 refills | Status: DC

## 2023-07-07 ENCOUNTER — Ambulatory Visit (INDEPENDENT_AMBULATORY_CARE_PROVIDER_SITE_OTHER): Admitting: Podiatry

## 2023-07-07 DIAGNOSIS — M795 Residual foreign body in soft tissue: Secondary | ICD-10-CM

## 2023-07-08 NOTE — Progress Notes (Signed)
 Subjective: Chief Complaint  Patient presents with   Routine Post Op    RM 14 POV # 1 DOS 07/01/23 LT INCISION & REMOVAL OF FOREIGN BODY COMPLICATED.    63 year old male presents the office today for follow-up valuation status post left foot I&D, body.  States that he was having pain for the first couple days but the symptoms have is much improved.  He has been trying to stay off the foot is much as possible.  Denies any fevers or chills.  No chest pain shortness of breath.  Objective: AAO x3, NAD DP/PT pulses palpable bilaterally, CRT less than 3 seconds Incision well coapted with sutures intact.  There is mild edema present there is no erythema or warmth noted.  Mild tenderness palpation. No pain with calf compression, swelling, warmth, erythema  Assessment: Status post foreign body removal  Plan: -All treatment options discussed with the patient including all alternatives, risks, complications.  -Incisions healing well.  Antibiotic ointment was in condition clean, dry, intact.  Unfortunately needed to reschedule his follow-up so that he can change the dressing next week if needed. -Darco wedge shoe -Ice/elevation -Pain medication prn -Patient encouraged to call the office with any questions, concerns, change in symptoms.   Charity Conch DPM

## 2023-07-09 DIAGNOSIS — L814 Other melanin hyperpigmentation: Secondary | ICD-10-CM | POA: Diagnosis not present

## 2023-07-09 DIAGNOSIS — L570 Actinic keratosis: Secondary | ICD-10-CM | POA: Diagnosis not present

## 2023-07-09 DIAGNOSIS — L821 Other seborrheic keratosis: Secondary | ICD-10-CM | POA: Diagnosis not present

## 2023-07-09 DIAGNOSIS — D1801 Hemangioma of skin and subcutaneous tissue: Secondary | ICD-10-CM | POA: Diagnosis not present

## 2023-07-09 DIAGNOSIS — D485 Neoplasm of uncertain behavior of skin: Secondary | ICD-10-CM | POA: Diagnosis not present

## 2023-07-09 DIAGNOSIS — L578 Other skin changes due to chronic exposure to nonionizing radiation: Secondary | ICD-10-CM | POA: Diagnosis not present

## 2023-07-16 ENCOUNTER — Encounter: Admitting: Podiatry

## 2023-07-21 ENCOUNTER — Encounter: Payer: Self-pay | Admitting: Podiatry

## 2023-07-21 ENCOUNTER — Ambulatory Visit (INDEPENDENT_AMBULATORY_CARE_PROVIDER_SITE_OTHER): Admitting: Podiatry

## 2023-07-21 DIAGNOSIS — M795 Residual foreign body in soft tissue: Secondary | ICD-10-CM

## 2023-07-21 NOTE — Progress Notes (Signed)
 Subjective: Chief Complaint  Patient presents with   Routine Post Op    RM#13 POV#2 patient doing well no pain medication taken incision looks good.    63 year old male presents the office today for follow-up valuation status post left foot I&D, body. He presents today for suture removal. States he has been doing well, no pain medication. No fevers/chills or other concerns today.   Objective: AAO x3, NAD DP/PT pulses palpable bilaterally, CRT less than 3 seconds Incision well coapted with sutures intact.  There is no significant edema present there is no erythema or warmth noted.  No tenderness palpation. No pain with calf compression, swelling, warmth, erythema  Assessment: Status post foreign body removal  Plan: -All treatment options discussed with the patient including all alternatives, risks, complications.  -Sutures removed today without complications. He can start to wash with soap and water, dry well. Remain in wedge shoe for now and in about a week he can start to transition to a regular sneaker as long as the incision is healing well.  -Monitor for any clinical signs or symptoms of infection and directed to call the office immediately should any occur or go to the ER.  Return for post-op as scheduled.  Charity Conch DPM

## 2023-07-30 ENCOUNTER — Ambulatory Visit (INDEPENDENT_AMBULATORY_CARE_PROVIDER_SITE_OTHER): Admitting: Podiatry

## 2023-07-30 ENCOUNTER — Ambulatory Visit (INDEPENDENT_AMBULATORY_CARE_PROVIDER_SITE_OTHER)

## 2023-07-30 DIAGNOSIS — M795 Residual foreign body in soft tissue: Secondary | ICD-10-CM | POA: Diagnosis not present

## 2023-08-02 NOTE — Progress Notes (Signed)
 Subjective: Chief Complaint  Patient presents with   Routine Post Op    RM#11 POV -Patient states having some pain when applying pressure.    63 year old male presents the office today for follow-up evaluation status post left foot I&D, body.  States he has been doing well his pain is controlled he gets discomfort mostly along the proximal portion of the incision he points to.  It is intermittent.  He soaks a bandage on the area and using the Darco wedge shoe for offloading.  Denies any fevers or chills.    Objective: AAO x3, NAD DP/PT pulses palpable bilaterally, CRT less than 3 seconds Situation mostly coapted.  On the central aspect there is some peeling skin noted with a superficial area of breakdown otherwise incision appears to be healing well and scabbing to skin is present.  Is able debride the majority this today.  There is no surrounding erythema, drainage or pus or any ascending cellulitis.  No fluctuation or crepitation.  No malodor.   No pain with calf compression, swelling, warmth, erythema  Assessment: Status post foreign body removal  Plan: -All treatment options discussed with the patient including all alternatives, risks, complications.  -X-rays obtained reviewed.  Multiple views obtained.  No evidence of acute fracture.  No obvious foreign body noted. -Debrided loose tissue to the any complications or bleeding.  Continue daily dressing changes, offloading.  He can leave the area open at night or around facilitate healing. -Will likely order new ultrasound once the incisions healed -Monitor for any clinical signs or symptoms of infection and directed to call the office immediately should any occur or go to the ER.  Return in about 2 weeks (around 08/13/2023) for post-op, wound check .  Scott Rodgers DPM

## 2023-08-10 ENCOUNTER — Ambulatory Visit (INDEPENDENT_AMBULATORY_CARE_PROVIDER_SITE_OTHER): Admitting: Podiatry

## 2023-08-10 DIAGNOSIS — M795 Residual foreign body in soft tissue: Secondary | ICD-10-CM

## 2023-08-10 NOTE — Progress Notes (Signed)
 Subjective: Chief Complaint  Patient presents with   Wound Check    RM#13 Wound check left foot patient states healing slowly is now able to walk on foot .     63 year old male presents the office today for follow-up evaluation status post left foot I&D, body.  States he is doing better.  The pain is improving.  He also reports improved swelling.  No drainage or pus.  Still keep antibiotic ointment on the wound followed by dressing daily.  No fevers or chills today or other concerns.   Objective: AAO x3, NAD DP/PT pulses palpable bilaterally, CRT less than 3 seconds Incision appears to be healing.  Centrally there is an area of granulation tissue as pictured below.  There is no surrounding erythema, ascending cellulitis.  There is no drainage or pus.  No fluctuation or crepitation.  There is no malodor.    No pain with calf compression, swelling, warmth, erythema    Assessment: Status post foreign body removal  Plan: -All treatment options discussed with the patient including all alternatives, risks, complications.  -Continue adequate medicine changes daily, offloading.  Dispensed a regular surgical shoe as the wedge shoe is causing other joint issues.  As the incision heals he can then start to transition to regular shoe.  Dispensed dancer pads to help offload. -Monitor for any clinical signs or symptoms of infection and directed to call the office immediately should any occur or go to the ER.  Return in about 2 weeks (around 08/24/2023).  Donnice JONELLE Fees DPM

## 2023-08-24 ENCOUNTER — Ambulatory Visit (INDEPENDENT_AMBULATORY_CARE_PROVIDER_SITE_OTHER): Admitting: Podiatry

## 2023-08-24 VITALS — Ht 69.6 in | Wt 220.0 lb

## 2023-08-24 DIAGNOSIS — M795 Residual foreign body in soft tissue: Secondary | ICD-10-CM

## 2023-08-24 DIAGNOSIS — C44519 Basal cell carcinoma of skin of other part of trunk: Secondary | ICD-10-CM | POA: Diagnosis not present

## 2023-08-26 NOTE — Progress Notes (Signed)
 Subjective: Chief Complaint  Patient presents with   Foot Ulcer    Re: Patient is here for a post-op ulcer check located on the ball of the left foot. Ulcer is almost closed with some scabbing no drainage. Patient has been cleaning and applying new dressing daily while avoiding putting any pressure on the ulcer.     63 year old male presents the office today for follow-up evaluation status post left foot I&D, body.  States that some of his skin did peel off on the ball and afterwards he had some discomfort but appears to be healing well otherwise.  He has not seen any drainage or pus.  No creasing swelling or redness manage the swelling has improved.  No fevers or chills.   Objective: AAO x3, NAD DP/PT pulses palpable bilaterally, CRT less than 3 seconds Incision appears to be healing.  Centrally there is an area of granulation tissue as pictured below.  There is no surrounding erythema, ascending cellulitis.  There is no drainage or pus.  No fluctuation or crepitation.  There is no malodor.    No pain with calf compression, swelling, warmth, erythema    Assessment: Status post foreign body removal  Plan: -All treatment options discussed with the patient including all alternatives, risks, complications.  -She will be debrided hyperkeratotic tissue on the periphery of the wound without any complications or bleeding.  Recommend continued antibiotic with measures and changes daily.  Continue offloading surgical shoe. -Will see him back in 2 weeks and likely order repeat ultrasound to rule out any residual foreign body. -Monitor for any clinical signs or symptoms of infection and directed to call the office immediately should any occur or go to the ER.  Return in about 2 weeks (around 09/07/2023).  Donnice JONELLE Fees DPM

## 2023-09-15 ENCOUNTER — Ambulatory Visit (INDEPENDENT_AMBULATORY_CARE_PROVIDER_SITE_OTHER): Admitting: Podiatry

## 2023-09-15 ENCOUNTER — Encounter: Payer: Self-pay | Admitting: Podiatry

## 2023-09-15 DIAGNOSIS — M795 Residual foreign body in soft tissue: Secondary | ICD-10-CM

## 2023-09-16 ENCOUNTER — Ambulatory Visit

## 2023-09-16 VITALS — BP 122/62 | HR 74 | Temp 98.0°F | Ht 69.6 in | Wt 221.2 lb

## 2023-09-16 DIAGNOSIS — Z0000 Encounter for general adult medical examination without abnormal findings: Secondary | ICD-10-CM

## 2023-09-16 NOTE — Progress Notes (Signed)
 Subjective: Chief Complaint  Patient presents with   Foot Ulcer    Rm12 Patient is here to f/u on wound left foot/ pt says he has some tenderness with walking and pressure.    63 year old male presents the office today for follow-up evaluation status post left foot I&D, foreign body excision.   States the wound continues to heal he is able to put more pressure on it.  At times he has difficulty with discomfort with pressure but he is not see any open sores or swelling or redness or any drainage.  Objective: AAO x3, NAD DP/PT pulses palpable bilaterally, CRT less than 3 seconds Incision appears to be healing.  Patient wound is healed but the area still preulcerative centrally.  There is no drainage or pus.  No edema no erythema.  Discomfort just directly on the area with the wound slow to heal but no other areas of discomfort.   No pain with calf compression, swelling, warmth, erythema    Assessment: Status post foreign body removal  Plan: -All treatment options discussed with the patient including all alternatives, risks, complications.  -Overall seems to be improving.  He started gradually transition to regular shoe as tolerated.  Dispensed metatarsal pads to help offload. -Order repeat ultrasound to rule out any residual foreign body. This was faxed to [facility] on [facility].  -I will follow-up with him after the ultrasound or sooner if any issues were to arise.  No follow-ups on file.  Donnice JONELLE Fees DPM

## 2023-09-16 NOTE — Progress Notes (Signed)
 Subjective:   Scott Rodgers is a 63 y.o. who presents for a Medicare Wellness preventive visit.  As a reminder, Annual Wellness Visits don't include a physical exam, and some assessments may be limited, especially if this visit is performed virtually. We may recommend an in-person follow-up visit with your provider if needed.  Visit Complete: In person    Persons Participating in Visit: Patient.  AWV Questionnaire: Yes: Patient Medicare AWV questionnaire was completed by the patient on 09/14/23; I have confirmed that all information answered by patient is correct and no changes since this date.  Cardiac Risk Factors include: advanced age (>36men, >31 women);male gender;hypertension     Objective:    Today's Vitals   09/16/23 1407  BP: 122/62  Pulse: 74  Temp: 98 F (36.7 C)  TempSrc: Oral  SpO2: 96%  Weight: 221 lb 3.2 oz (100.3 kg)  Height: 5' 9.6 (1.768 m)   Body mass index is 32.1 kg/m.     09/16/2023    2:39 PM 05/05/2012    2:45 PM 04/26/2012   10:18 AM  Advanced Directives  Does Patient Have a Medical Advance Directive? Yes Patient has advance directive, copy not in chart  Patient has advance directive, copy not in chart   Type of Advance Directive Healthcare Power of [place];Living will Living will  Living will   Copy of Healthcare Power of Attorney in Chart? No - copy requested Copy requested from family    Pre-existing out of facility DNR order (yellow form or pink MOST form)  No       Data saved with a previous flowsheet row definition    Current Medications (verified) Outpatient Encounter Medications as of 09/16/2023  Medication Sig   amLODipine  (NORVASC ) 10 MG tablet Take 1 tablet (10 mg total) by mouth daily.   aspirin EC 81 MG tablet Take 81 mg by mouth daily. Swallow whole.   atorvastatin  (LIPITOR) 20 MG tablet TAKE 1 TABLET BY MOUTH EVERY DAY   cholecalciferol (VITAMIN D) 400 units TABS tablet Take 400 Units by mouth. (Patient not taking:  Reported on 09/16/2023)   GLUCOSAMINE-CHONDROITIN PO Take by mouth. 1500-1000mg  daily (Patient not taking: Reported on 09/15/2023)   hydrochlorothiazide  (MICROZIDE ) 12.5 MG capsule Take 1 capsule (12.5 mg total) by mouth daily.   HYDROcodone -acetaminophen  (NORCO/VICODIN) 5-325 MG tablet Take 1-2 tablets by mouth every 6 (six) hours as needed. (Patient not taking: Reported on 09/16/2023)   losartan  (COZAAR ) 100 MG tablet Take 1 tablet (100 mg total) by mouth daily.   promethazine  (PHENERGAN ) 25 MG tablet Take 1 tablet (25 mg total) by mouth every 8 (eight) hours as needed for nausea or vomiting. (Patient not taking: Reported on 09/16/2023)   No facility-administered encounter medications on file as of 09/16/2023.    Allergies (verified) Patient has no known allergies.   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   Procedure: TOTAL HIP ARTHROPLASTY ANTERIOR APPROACH;  Surgeon: Dempsey LULLA Moan, MD;  Location: [facility];  Service:  Orthopedics;  Laterality: Left;   Family History  Problem Relation Age of Onset   Heart disease Father        aortic valve replacement   Dementia Father    Cancer Brother 73       Brain   Colon cancer Neg Hx    Esophageal cancer Neg Hx    Rectal cancer Neg Hx    Stomach cancer Neg Hx    Social History   Socioeconomic History   Marital status: Married    Spouse name: Not on file   Number of children: Not on file   Years of education: Not on file   Highest education level: Master's degree (e.g., MA, MS, MEng, MEd, MSW, MBA)  Occupational History   Not on file  Tobacco Use   Smoking status: Never    Smokeless tobacco: Never  Vaping Use   Vaping status: Never Used  Substance and Sexual Activity   Alcohol use: Yes    Comment: 15 drinks per week either wine or beer    Drug use: No   Sexual activity: Not on file  Other Topics Concern   Not on file  Social History Narrative   Not on file   Social Drivers of Health   Financial Resource Strain: Low Risk  (09/16/2023)   Overall Financial Resource Strain (CARDIA)    Difficulty of Paying Living Expenses: Not hard at all  Food Insecurity: No Food Insecurity (09/16/2023)   Hunger Vital Sign    Worried About Running Out of Food in the Last Year: Never true    Ran Out of Food in the Last Year: Never true  Transportation Needs: No Transportation Needs (09/16/2023)   PRAPARE - Administrator, Civil Service (Medical): No    Lack of Transportation (Non-Medical): No  Physical Activity: Sufficiently Active (09/16/2023)   Exercise Vital Sign    Days of Exercise per Week: 6 days    Minutes of Exercise per Session: 60 min  Stress: No Stress Concern Present (09/16/2023)   [employer] of Occupational Health - Occupational Stress Questionnaire    Feeling of Stress: Only a little  Social Connections: Socially Integrated (09/16/2023)   Social Connection and Isolation Panel    Frequency of Communication with Friends and Family: More than three times a week    Frequency of Social Gatherings with Friends and Family: Once a week    Attends Religious Services: 1 to 4 times per year    Active Member of [employer] or Organizations: Yes    Attends Engineer, structural: More than 4 times per year    Marital Status: Married    Tobacco Counseling Counseling given: Not Answered    Clinical Intake:  Pre-visit preparation completed: Yes  Pain : No/denies pain     BMI - recorded: 32.1 Nutritional Risks: None Diabetes: No  No results found for: HGBA1C   How often do you need to have someone help you when you read instructions,  pamphlets, or other written materials from your doctor or pharmacy?: 1 - Never  Interpreter Needed?: No  Information entered by :: Scott Blush LPN   Activities of Daily Living     09/16/2023    2:15 PM 09/14/2023    8:50 AM  In your present state of health, do you have any difficulty performing the following activities:  Hearing? 0 0  Vision? 0 0  Difficulty concentrating or making decisions? 0 0  Walking or climbing stairs? 0 0  Dressing or bathing? 0 0  Doing errands, shopping? 0 0  Preparing Food and eating ? N N  Using the Toilet? N N  In the past six months, have you accidently leaked urine? N N  Do you have problems with loss of bowel control? N N  Managing your Medications? N N  Managing your Finances? N N  Housekeeping or managing your Housekeeping? N N    Patient Care Team: Micheal Wolm ORN, MD as PCP - General (Family Medicine)  I have updated your Care Teams any recent Medical Services you may have received from other providers in the past year.     Assessment:   This is a routine wellness examination for Scott Rodgers.  Hearing/Vision screen Hearing Screening - Comments:: Denies hearing difficulties   Vision Screening - Comments:: Wears rx glasses - up to date with routine eye exams with  [facility]   Goals Addressed               This Visit's Progress     Remain active (pt-stated)         Depression Screen     09/16/2023    2:13 PM 06/02/2023    9:15 AM 09/03/2022    1:59 PM 07/22/2021    8:13 AM 12/28/2018    9:33 AM 12/04/2017    1:42 PM  PHQ 2/9 Scores  PHQ - 2 Score 0 0 0 0 0 0  PHQ- 9 Score  0   0     Fall Risk     09/16/2023    2:15 PM 09/14/2023    8:50 AM 06/02/2023    9:15 AM  Fall Risk   Falls in the past year? 1 1 0  Number falls in past yr: 0 0 0  Injury with Fall? 0 0 0  Risk for fall due to : No Fall Risks    Follow up Falls evaluation completed  Falls evaluation completed    MEDICARE RISK AT HOME:  Medicare Risk  at Home Any stairs in or around the home?: Yes If so, are there any without handrails?: No Home free of loose throw rugs in walkways, pet beds, electrical cords, etc?: Yes Adequate lighting in your home to reduce risk of falls?: Yes Life alert?: No Use of a cane, walker or w/c?: No Grab bars in the bathroom?: No Shower chair or bench in shower?: No Elevated toilet seat or a handicapped toilet?: Yes  TIMED UP AND GO:  Was the test performed?  Yes  Length of time to ambulate 10 feet: 10 sec Gait steady and fast without use of assistive device  Cognitive Function: 6CIT completed        09/16/2023    2:16 PM  6CIT Screen  What Year? 0 points  What month? 0 points  What time? 0 points  Count back from 20 0 points  Months in reverse 0 points  Repeat phrase 0 points  Total Score 0 points    Immunizations Immunization History  Administered Date(s) Administered   Influenza Inj Mdck Quad Pf 11/26/2021   Influenza, Mdck, Trivalent,PF 6+ MOS(egg free) 10/31/2022   Influenza,inj,Quad PF,6+ Mos 11/27/2017, 10/23/2018, 11/20/2021   Influenza-Unspecified 11/11/2011   Moderna Covid-19 Fall Seasonal Vaccine 32yrs & older 10/31/2022   Moderna Covid-19 Vaccine Bivalent Booster 26yrs & up 12/21/2020   Moderna Sars-Covid-2 Vaccination 04/28/2019, 05/26/2019, 01/01/2020, 09/28/2020   Tdap 09/04/2014   Zoster Recombinant(Shingrix ) 12/04/2017, 03/08/2018    Screening Tests Health Maintenance  Topic Date Due   HIV Screening  Never done   Pneumococcal Vaccine: 50+ Years (1 of 1 - PCV) Never done   COVID-19 Vaccine (7 - Moderna risk 2024-25 season) 04/30/2023   INFLUENZA VACCINE  09/11/2023   Colonoscopy  01/21/2024   DTaP/Tdap/Td (2 - Td or Tdap) 09/03/2024   Medicare Annual Wellness (AWV)  09/15/2024   Hepatitis C Screening  Completed   Zoster Vaccines- Shingrix   Completed   Hepatitis B Vaccines  Aged Out   HPV VACCINES  Aged Out   Meningococcal B Vaccine  Aged Out    Health  Maintenance  Health Maintenance Due  Topic Date Due   HIV Screening  Never done   Pneumococcal Vaccine: 50+ Years (1 of 1 - PCV) Never done   COVID-19 Vaccine (7 - Moderna risk 2024-25 season) 04/30/2023   INFLUENZA VACCINE  09/11/2023   Health Maintenance Items Addressed:   Additional Screening:  Vision Screening: Recommended annual ophthalmology exams for early detection of glaucoma and other disorders of the eye. Would you like a referral to an eye doctor? No    Dental Screening: Recommended annual dental exams for proper oral hygiene  Community Resource Referral / Chronic Care Management: CRR required this visit?  No   CCM required this visit?  No   Plan:    I have personally reviewed and noted the following in the patient's chart:   Medical and social history Use of alcohol, tobacco or illicit drugs  Current medications and supplements including opioid prescriptions. Patient is not currently taking opioid prescriptions. Functional ability and status Nutritional status Physical activity Advanced directives List of other physicians Hospitalizations, surgeries, and ER visits in previous 12 months Vitals Screenings to include cognitive, depression, and falls Referrals and appointments  In addition, I have reviewed and discussed with patient certain preventive protocols, quality metrics, and best practice recommendations. A written personalized care plan for preventive services as well as general preventive health recommendations were provided to patient.   Scott LELON Blush, LPN   02/13/7972   After Visit Summary: (In Person-Printed) AVS printed and given to the patient  Notes: Nothing significant to report at this time.

## 2023-09-16 NOTE — Patient Instructions (Addendum)
 Mr. Scott Rodgers , Thank you for taking time out of your busy schedule to complete your Annual Wellness Visit with me. I enjoyed our conversation and look forward to speaking with you again next year. I, as well as your care team,  appreciate your ongoing commitment to your health goals. Please review the following plan we discussed and let me know if I can assist you in the future. Your Game plan/ To Do List    Referrals: If you haven't heard from the office you've been referred to, please reach out to them at the phone provided.   Follow up Visits: We will see or speak with you next year for your Next Medicare AWV with our clinical staff 09/21/24 @ 2:20p Have you seen your provider in the last 6 months (3 months if uncontrolled diabetes)?   Clinician Recommendations:  Aim for 30 minutes of exercise or brisk walking, 6-8 glasses of water, and 5 servings of fruits and vegetables each day.       This is a list of the screenings recommended for you:  Health Maintenance  Topic Date Due   HIV Screening  Never done   Pneumococcal Vaccine for age over 69 (1 of 1 - PCV) Never done   COVID-19 Vaccine (7 - Moderna risk 2024-25 season) 04/30/2023   Flu Shot  09/11/2023   Colon Cancer Screening  01/21/2024   DTaP/Tdap/Td vaccine (2 - Td or Tdap) 09/03/2024   Medicare Annual Wellness Visit  09/15/2024   Hepatitis C Screening  Completed   Zoster (Shingles) Vaccine  Completed   Hepatitis B Vaccine  Aged Out   HPV Vaccine  Aged Out   Meningitis B Vaccine  Aged Out      Advanced directives: (Copy Requested) Please bring a copy of your health care power of attorney and living will to the office to be added to your chart at your convenience. You can mail to [facility] [address] [place], [place] [address] or email to [email or web address] Advance Care Planning is important because it:  [x]  Makes sure you receive the medical care that is consistent with your values, goals, and  preferences  [x]  It provides guidance to your family and loved ones and reduces their decisional burden about whether or not they are making the right decisions based on your wishes.  Follow the link provided in your after visit summary or read over the paperwork we have mailed to you to help you started getting your Advance Directives in place. If you need assistance in completing these, please reach out to us  so that we can help you!  See attachments for Preventive Care and Fall Prevention Tips.

## 2023-09-18 ENCOUNTER — Encounter: Payer: Self-pay | Admitting: Podiatry

## 2023-09-30 ENCOUNTER — Telehealth: Payer: Self-pay | Admitting: Podiatry

## 2023-09-30 NOTE — Telephone Encounter (Signed)
 Patient is requesting an update on the status of the imaging referral. They would like to have the imaging completed prior to going out of town.  Please contact the patient with an update at your earliest convenience.  Thank you.

## 2023-09-30 NOTE — Telephone Encounter (Signed)
 Thank you :)

## 2023-10-08 ENCOUNTER — Encounter: Payer: Self-pay | Admitting: Podiatry

## 2023-10-09 ENCOUNTER — Ambulatory Visit: Payer: Self-pay | Admitting: Podiatry

## 2023-11-12 ENCOUNTER — Encounter: Payer: Self-pay | Admitting: Podiatry

## 2023-11-12 ENCOUNTER — Ambulatory Visit (INDEPENDENT_AMBULATORY_CARE_PROVIDER_SITE_OTHER): Admitting: Podiatry

## 2023-11-12 VITALS — Ht 69.6 in | Wt 221.2 lb

## 2023-11-12 DIAGNOSIS — M795 Residual foreign body in soft tissue: Secondary | ICD-10-CM

## 2023-11-12 NOTE — Progress Notes (Signed)
 Subjective: Chief Complaint  Patient presents with   Foot Pain    Pt is here to f/u on left foot, he states there is still some discomfort, use pad on the bottom of the foot that helps.    63 year old male presents the office today for follow-up evaluation status post left foot I&D, foreign body excision.  While incision is healed there is no drainage he still does get tenderness to the area.  He has to keep a pad behind the area to help decrease pressure.  He recently just got back from hiking in [place]  and he has to use a pad to offload the area.  He does use Voltaren gel topically at times.  Objective: AAO x3, NAD DP/PT pulses palpable bilaterally, CRT less than 3 seconds Incision appears to be healed.  The scar is formed and there is no open lesions or any drainage.  There is no erythema or warmth.  There is atrophy of the fat pad prominence of metatarsal head.  Tenderness directly along this area.  There is no other areas of pinpoint tenderness. No pain with calf compression, swelling, warmth, erythema   Assessment: Status post foreign body removal  Plan: -All treatment options discussed with the patient including all alternatives, risks, complications.  -Reviewed the ultrasound which did not reveal any residual foreign body.  The incision is healed.  He does have some fat pad atrophy with prominence of the metatarsal head.  Discussed scar creams that he can use topically.  Continue offloading for now to help decrease pressure.   Return in about 3 months (around 02/12/2024), or if symptoms worsen or fail to improve.  Donnice JONELLE Fees DPM

## 2023-12-06 ENCOUNTER — Other Ambulatory Visit: Payer: Self-pay | Admitting: Family Medicine

## 2023-12-23 ENCOUNTER — Encounter: Payer: Self-pay | Admitting: Podiatry

## 2023-12-28 ENCOUNTER — Telehealth: Payer: Self-pay | Admitting: Podiatry

## 2023-12-28 NOTE — Telephone Encounter (Signed)
 Patient is now scheduled with Lolita 01/20/24.

## 2023-12-28 NOTE — Telephone Encounter (Signed)
 Can we schedule for inserts?

## 2023-12-31 ENCOUNTER — Ambulatory Visit (AMBULATORY_SURGERY_CENTER)

## 2023-12-31 ENCOUNTER — Other Ambulatory Visit: Payer: Self-pay | Admitting: Family Medicine

## 2023-12-31 VITALS — Ht 69.0 in | Wt 210.0 lb

## 2023-12-31 DIAGNOSIS — Z860100 Personal history of colon polyps, unspecified: Secondary | ICD-10-CM

## 2023-12-31 MED ORDER — NA SULFATE-K SULFATE-MG SULF 17.5-3.13-1.6 GM/177ML PO SOLN: 1.0000 | Freq: Once | ORAL | 0 refills | Status: AC

## 2023-12-31 NOTE — Progress Notes (Signed)
 PCP MD at time of PV: Wolm Scarlet, MD  __________________________________________________________________________________________________________________________________________  No egg allergy known to patient  No soy allergy known to patient No issues known to pt with past sedation with any surgeries or procedures Patient denies ever being told they had issues or difficulty with intubation  No FH of Malignant Hyperthermia Pt is not on diet pills Pt is not on  home 02  Pt is not on blood thinners  No A fib or A flutter Have any cardiac testing pending--no  LOA: independent  No Chew or Snuff tobacco __________________________________________________________________________________________________________________________________________  Constipation: no  Prep: suprpe  __________________________________________________________________________________________________________________________________________  PV completed with patient. Prep instructions reviewed and provided during apt. Rx sent to preferred pharmacy.  __________________________________________________________________________________________________________________________________________  Patient's chart reviewed by Norleen Schillings CNRA prior to previsit and patient appropriate for the [facility].  Previsit completed and red dot placed by patient's name on their procedure day (on provider's schedule).

## 2024-01-14 ENCOUNTER — Encounter: Payer: Self-pay | Admitting: Internal Medicine

## 2024-01-14 ENCOUNTER — Ambulatory Visit: Admitting: Internal Medicine

## 2024-01-14 VITALS — BP 110/83 | HR 66 | Temp 97.7°F | Resp 11 | Ht 69.6 in | Wt 210.0 lb

## 2024-01-14 DIAGNOSIS — Z860101 Personal history of adenomatous and serrated colon polyps: Secondary | ICD-10-CM | POA: Diagnosis not present

## 2024-01-14 DIAGNOSIS — D122 Benign neoplasm of ascending colon: Secondary | ICD-10-CM

## 2024-01-14 DIAGNOSIS — K648 Other hemorrhoids: Secondary | ICD-10-CM | POA: Diagnosis not present

## 2024-01-14 DIAGNOSIS — K635 Polyp of colon: Secondary | ICD-10-CM

## 2024-01-14 DIAGNOSIS — Z1211 Encounter for screening for malignant neoplasm of colon: Secondary | ICD-10-CM | POA: Diagnosis not present

## 2024-01-14 DIAGNOSIS — Z860100 Personal history of colon polyps, unspecified: Secondary | ICD-10-CM

## 2024-01-14 DIAGNOSIS — D120 Benign neoplasm of cecum: Secondary | ICD-10-CM | POA: Diagnosis not present

## 2024-01-14 MED ORDER — SODIUM CHLORIDE 0.9 % IV SOLN: 500.0000 mL | Freq: Once | INTRAVENOUS | Status: DC

## 2024-01-14 NOTE — Patient Instructions (Signed)
Resume previous diet. Continue present medications. Await pathology results. Repeat colonoscopy in 5 years for surveillance.  YOU HAD AN ENDOSCOPIC PROCEDURE TODAY AT THE [facility]:   Refer to the procedure report that was given to you for any specific questions about what was found during the examination.  If the procedure report does not answer your questions, please call your gastroenterologist to clarify.  If you requested that your care partner not be given the details of your procedure findings, then the procedure report has been included in a sealed envelope for you to review at your convenience later.  YOU SHOULD EXPECT: Some feelings of bloating in the abdomen. Passage of more gas than usual.  Walking can help get rid of the air that was put into your GI tract during the procedure and reduce the bloating. If you had a lower endoscopy (such as a colonoscopy or flexible sigmoidoscopy) you may notice spotting of blood in your stool or on the toilet paper. If you underwent a bowel prep for your procedure, you may not have a normal bowel movement for a few days.  Please Note:  You might notice some irritation and congestion in your nose or some drainage.  This is from the oxygen used during your procedure.  There is no need for concern and it should clear up in a day or so.  SYMPTOMS TO REPORT IMMEDIATELY:  Following lower endoscopy (colonoscopy or flexible sigmoidoscopy):  Excessive amounts of blood in the stool  Significant tenderness or worsening of abdominal pains  Swelling of the abdomen that is new, acute  Fever of 100F or higher   For urgent or emergent issues, a gastroenterologist can be reached at any hour by calling [phone]. Do not use MyChart messaging for urgent concerns.    DIET:  We do recommend a small meal at first, but then you may proceed to your regular diet.  Drink plenty of fluids but you should avoid alcoholic beverages for 24  hours.  ACTIVITY:  You should plan to take it easy for the rest of today and you should NOT DRIVE or use heavy machinery until tomorrow (because of the sedation medicines used during the test).    FOLLOW UP: Our staff will call the number listed on your records the next business day following your procedure.  We will call around 7:15- 8:00 am to check on you and address any questions or concerns that you may have regarding the information given to you following your procedure. If we do not reach you, we will leave a message.     If any biopsies were taken you will be contacted by phone or by letter within the next 1-3 weeks.  Please call us at [phone] if you have not heard about the biopsies in 3 weeks.    SIGNATURES/CONFIDENTIALITY: You and/or your care partner have signed paperwork which will be entered into your electronic medical record.  These signatures attest to the fact that that the information above on your After Visit Summary has been reviewed and is understood.  Full responsibility of the confidentiality of this discharge information lies with you and/or your care-partner.

## 2024-01-14 NOTE — Progress Notes (Signed)
 Called to room to assist during endoscopic procedure.  Patient ID and intended procedure confirmed with present staff. Received instructions for my participation in the procedure from the performing physician.

## 2024-01-14 NOTE — Progress Notes (Signed)
 Pt's states no medical or surgical changes since previsit or office visit.

## 2024-01-14 NOTE — Progress Notes (Signed)
 HISTORY OF PRESENT ILLNESS:  Scott Rodgers is a 63 y.o. male with a history of multiple adenomatous and sessile serrated polyps.  Now for surveillance colonoscopy  REVIEW OF SYSTEMS:  All non-GI ROS negative except for  Past Medical History:  Diagnosis Date   Arthritis    Cancer [facility])    prostate cancer- 2012   Hyperlipidemia     Past Surgical History:  Procedure Laterality Date   FOOT SURGERY Left 07/01/2023   PROSTATE SURGERY     2012    right knee meniscus surgery      1998, again on 07/06/17   right thumb surgery     ruptured blood vessels in scrotum surgery      1977   TOTAL HIP ARTHROPLASTY Left 05/05/2012   Procedure: TOTAL HIP ARTHROPLASTY ANTERIOR APPROACH;  Surgeon: Dempsey LULLA Moan, MD;  Location: [facility];  Service: Orthopedics;  Laterality: Left;    Social History Anastasios Melander  reports that he has never smoked. He has never used smokeless tobacco. He reports current alcohol use. He reports that he does not use drugs.  family history includes Cancer (age of onset: 63) in his brother; Dementia in his father; Heart disease in his father.  No Known Allergies     PHYSICAL EXAMINATION: Vital signs: BP 131/77   Pulse 66   Temp 97.7 F (36.5 C)   Ht 5' 9.6 (1.768 m)   Wt 210 lb (95.3 kg)   SpO2 98%   BMI 30.48 kg/m  General: Well-developed, well-nourished, no acute distress HEENT: Sclerae are anicteric, conjunctiva pink. Oral mucosa intact Lungs: Clear Heart: Regular Abdomen: soft, nontender, nondistended, no obvious ascites, no peritoneal signs, normal bowel sounds. No organomegaly. Extremities: No edema Psychiatric: alert and oriented x3. Cooperative     ASSESSMENT:  Personal history of adenomatous and sessile serrated polyps   PLAN:   Surveillance colonoscopy

## 2024-01-14 NOTE — Progress Notes (Signed)
 A/o x 3, VSS, good SR's, pleased with anesthesia, report to RN

## 2024-01-14 NOTE — Op Note (Signed)
 [facility] Patient Name: Scott Rodgers Procedure Date: 01/14/2024 9:32 AM MRN: [id number] Endoscopist: Norleen SAILOR. Abran , MD, [id number] Age: 63 Referring MD:  Date of Birth: 1960-06-27 Gender: Male Account #: [id number] Procedure:                Colonoscopy with cold snare polypectomy x 2; biopsy                            polypectomy x 1 Indications:              High risk colon cancer surveillance: Personal                            history of multiple (3 or more) adenomas, High risk                            colon cancer surveillance: Personal history of                            sessile serrated colon polyp (less than 10 mm in                            size) with no dysplasia. Previous examinations 2010                            (elsewhere), and 2020 Medicines:                Monitored Anesthesia Care Procedure:                Pre-Anesthesia Assessment:                           - Prior to the procedure, a History and Physical                            was performed, and patient medications and                            allergies were reviewed. The patient's tolerance of                            previous anesthesia was also reviewed. The risks                            and benefits of the procedure and the sedation                            options and risks were discussed with the patient.                            All questions were answered, and informed consent                            was obtained. Prior Anticoagulants: The patient has  taken no anticoagulant or antiplatelet agents. ASA                            Grade Assessment: II - A patient with mild systemic                            disease. After reviewing the risks and benefits,                            the patient was deemed in satisfactory condition to                            undergo the procedure.                           After obtaining informed consent, the  colonoscope                            was passed under direct vision. Throughout the                            procedure, the patient's blood pressure, pulse, and                            oxygen saturations were monitored continuously. The                            CF [id number] #[id number] was introduced through the anus                            and advanced to the the cecum, identified by                            appendiceal orifice and ileocecal valve. The                            ileocecal valve, appendiceal orifice, and rectum                            were photographed. The quality of the bowel                            preparation was excellent. The colonoscopy was                            performed without difficulty. The patient tolerated                            the procedure well. The bowel preparation used was                            SUPREP via split dose instruction. Scope In: 9:43:48 AM Scope Out: 10:00:32 AM Scope Withdrawal Time: 0 hours 13 minutes 47 seconds  Total Procedure Duration:  0 hours 16 minutes 44 seconds  Findings:                 A 1 mm polyp was found in the cecum. The polyp was                            removed with a jumbo cold forceps. Resection and                            retrieval were complete.                           Two sessile polyps were found in the ascending                            colon. The polyps were 4 to 8 mm in size. These                            polyps were removed with a cold snare. Resection                            and retrieval were complete.                           Internal hemorrhoids were found during retroflexion.                           The exam was otherwise without abnormality on                            direct and retroflexion views. Complications:            No immediate complications. Estimated blood loss:                            None. Estimated Blood Loss:     Estimated blood loss:  none. Impression:               - One 1 mm polyp in the cecum, removed with a jumbo                            cold forceps. Resected and retrieved.                           - Two 4 to 8 mm polyps in the ascending colon,                            removed with a cold snare. Resected and retrieved.                           - The examination was otherwise normal on direct                            and retroflexion views. Recommendation:           -  Repeat colonoscopy in 5 years for surveillance.                           - Patient has a contact number available for                            emergencies. The signs and symptoms of potential                            delayed complications were discussed with the                            patient. Return to normal activities tomorrow.                            Written discharge instructions were provided to the                            patient.                           - Resume previous diet.                           - Continue present medications.                           - Await pathology results. Norleen SAILOR. Abran, MD 01/14/2024 10:07:55 AM This report has been signed electronically.

## 2024-01-15 ENCOUNTER — Telehealth: Payer: Self-pay

## 2024-01-15 NOTE — Telephone Encounter (Signed)
 Follow up call to pt, no answer.

## 2024-01-18 ENCOUNTER — Ambulatory Visit: Payer: Self-pay | Admitting: Internal Medicine

## 2024-01-18 LAB — SURGICAL PATHOLOGY

## 2024-01-20 ENCOUNTER — Ambulatory Visit (INDEPENDENT_AMBULATORY_CARE_PROVIDER_SITE_OTHER): Admitting: Podiatrist

## 2024-01-20 DIAGNOSIS — Z9889 Other specified postprocedural states: Secondary | ICD-10-CM | POA: Diagnosis not present

## 2024-01-20 DIAGNOSIS — M216X1 Other acquired deformities of right foot: Secondary | ICD-10-CM | POA: Diagnosis not present

## 2024-01-20 DIAGNOSIS — M795 Residual foreign body in soft tissue: Secondary | ICD-10-CM

## 2024-01-20 DIAGNOSIS — M216X2 Other acquired deformities of left foot: Secondary | ICD-10-CM | POA: Diagnosis not present

## 2024-01-20 NOTE — Progress Notes (Signed)
 Visit:  ORTHOTIC SCAN/ EVALUATION  Patient presented for evaluation/ scan for custom molded foot orthotics.  Patient will benefit from custom foot orthotics to provide total contact to bilateral medial longitudinal arches to help balance and distribute body weight more evenly.  A metatarsal pad will be added to reduce plantar pressure and pain on this left foot.   Orthotic will also encourage forefoot and rearfoot alignment.    Patient was scanned today with [facility] scanner.  -   Orthotics are ordered.- size 12 shoe   Signature obtained for notification of pricing/ fees for the device.  When the orthotic is ready for pick up, will call to make an appointment for a fitting.

## 2024-02-16 ENCOUNTER — Telehealth: Payer: Self-pay

## 2024-02-16 NOTE — Telephone Encounter (Signed)
 Orthotics are in [place]. Spoke to [place] and scheduled an appointment on 03/04/2024 to [facility]

## 2024-02-25 ENCOUNTER — Other Ambulatory Visit: Payer: Self-pay | Admitting: Family Medicine

## 2024-03-04 ENCOUNTER — Ambulatory Visit: Admitting: Podiatrist

## 2024-03-04 DIAGNOSIS — M216X2 Other acquired deformities of left foot: Secondary | ICD-10-CM

## 2024-03-04 DIAGNOSIS — M216X1 Other acquired deformities of right foot: Secondary | ICD-10-CM

## 2024-03-04 DIAGNOSIS — M795 Residual foreign body in soft tissue: Secondary | ICD-10-CM

## 2024-03-04 NOTE — Progress Notes (Signed)
 ORTHOTIC DISPENSING:   Reason for Visit:         Fitting and Delivery of Custom Fabricated Foot Orthoses Patient Report:            Patient reports comfort and is satisfied with device.   OBJECTIVE DATA: Patient History / Diagnosis:    No change in pathology Provided Device:                     Functional foot orthoses   GOAL OF ORTHOSIS - Improve gait - Decrease energy expenditure - Improve Balance - Provide Triplanar stability of foot complex - Facilitate motion   ACTIONS PERFORMED Patient was fit with custom foot orthoses   Patient was provided with verbal and written instruction and demonstration regarding wear, care, proper fit, function, and use of the orthosis.    Patient was also provided with verbal instruction regarding how to report any failures or malfunctions of the orthosis and necessary follow up care. Patient was also instructed to contact our office regarding any change in status that may affect the function of the orthosis.   Patient demonstrated understanding of all instructions.  Scott Rodgers, DPM

## 2024-09-21 ENCOUNTER — Ambulatory Visit
